# Patient Record
Sex: Female | Born: 1975 | Race: Black or African American | Hispanic: No | Marital: Single | Smoking: Former smoker
Health system: Southern US, Community
[De-identification: ages and names within clinical notes are randomized; demographics above are authoritative.]

## PROBLEM LIST (undated history)

## (undated) DIAGNOSIS — N200 Calculus of kidney: Secondary | ICD-10-CM

## (undated) DIAGNOSIS — B1910 Unspecified viral hepatitis B without hepatic coma: Secondary | ICD-10-CM

## (undated) HISTORY — PX: TUBAL LIGATION: SHX77

## (undated) HISTORY — PX: ANKLE SURGERY: SHX546

## (undated) HISTORY — PX: KNEE SURGERY: SHX244

---

## 2010-01-18 ENCOUNTER — Emergency Department (HOSPITAL_COMMUNITY): Admission: EM | Admit: 2010-01-18 | Discharge: 2010-01-18 | Payer: Self-pay | Admitting: Family Medicine

## 2011-08-12 ENCOUNTER — Emergency Department (HOSPITAL_BASED_OUTPATIENT_CLINIC_OR_DEPARTMENT_OTHER)
Admission: EM | Admit: 2011-08-12 | Discharge: 2011-08-12 | Disposition: A | Discharge status: Home / Self Care | Payer: Medicaid Other | Attending: Emergency Medicine | Admitting: Emergency Medicine

## 2011-08-12 ENCOUNTER — Encounter: Payer: Self-pay | Admitting: *Deleted

## 2011-08-12 DIAGNOSIS — M65839 Other synovitis and tenosynovitis, unspecified forearm: Secondary | ICD-10-CM | POA: Insufficient documentation

## 2011-08-12 DIAGNOSIS — M65849 Other synovitis and tenosynovitis, unspecified hand: Secondary | ICD-10-CM | POA: Insufficient documentation

## 2011-08-12 DIAGNOSIS — E119 Type 2 diabetes mellitus without complications: Secondary | ICD-10-CM | POA: Insufficient documentation

## 2011-08-12 DIAGNOSIS — M779 Enthesopathy, unspecified: Secondary | ICD-10-CM

## 2011-08-12 MED ORDER — HYDROCODONE-ACETAMINOPHEN 5-500 MG PO TABS: 1.0000 | ORAL_TABLET | Freq: Four times a day (QID) | ORAL | Status: AC | PRN

## 2011-08-12 MED ORDER — HYDROCODONE-ACETAMINOPHEN 5-325 MG PO TABS
1.0000 | ORAL_TABLET | Freq: Once | ORAL | Status: AC
  Administered 2011-08-12: 1 via ORAL
  Filled 2011-08-12: qty 1

## 2011-08-12 MED ORDER — IBUPROFEN 800 MG PO TABS: 800.0000 mg | ORAL_TABLET | Freq: Three times a day (TID) | ORAL | Status: AC

## 2011-08-12 NOTE — ED Notes (Signed)
Pt states that she began having pain in her right wrist when she was 6 months pregnant. Given injection. Now having pain in left wrist.

## 2011-08-13 NOTE — ED Provider Notes (Signed)
History     CSN: [id number] Arrival date & time: 08/12/2011  8:25 PM  Chief Complaint  Patient presents with  . Wrist Pain   HPI Comments: 35yoF previously healthy pw b/l lateral wrist pain. States that she has experienced similar since pregancy, total 9 months she has been having the pain greater on Rt wrist. Worse with extension of thumb and wrist movement. Denies numbness/tingling/weakness of fingers. 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 Also lifts her child at home. Denies other injury/complaint  Patient is a 35 y.o. female presenting with wrist pain.  Wrist Pain    Past Medical History  Diagnosis Date  . Diabetes mellitus     Past Surgical History  Procedure Date  . Tubal ligation   . Ankle surgery     History reviewed. No pertinent family history.  History  Substance Use Topics  . Smoking status: Never Smoker   . Smokeless tobacco: Not on file  . Alcohol Use: No      Review of Systems  All other systems reviewed and are negative.  except as noted HPI   Physical Exam  BP 128/78  Pulse 84  Temp(Src) 98.2 F (36.8 C) (Oral)  Resp 20  Ht 5\' 6"  (1.676 m)  Wt 210 lb (95.255 kg)  BMI 33.89 kg/m2  SpO2 98%  Physical Exam  Nursing note and vitals reviewed. Constitutional: He is oriented to person, place, and time. He appears well-developed and well-nourished. No distress.  HENT:  Head: Atraumatic.  Mouth/Throat: Oropharynx is clear and moist.  Eyes: Conjunctivae are normal. Pupils are equal, round, and reactive to light.  Neck: Neck supple.  Cardiovascular: Normal rate, regular rhythm, normal heart sounds and intact distal pulses.  Exam reveals no gallop and no friction rub.   No murmur heard. Pulmonary/Chest: Effort normal. No respiratory distress. He has no wheezes. He has no rales.  Abdominal: Soft. Bowel sounds are normal. There is no tenderness. There is no rebound and no guarding.  Musculoskeletal: Normal range of  motion. He exhibits no edema and no tenderness.       Arms:      +ttp, no swelling/erythema  Neurological: He is alert and oriented to person, place, and time.       Gross sensation intact fingers Cap refill < 3  Skin: Skin is warm and dry.  Psychiatric: He has a normal mood and affect.    ED Course  Procedures  MDM Likely tendonitis. Wrist braces, pain control. Home with same. PMD f/u  Stefano Gaul, MD       Forbes Cellar, MD 08/13/11 [phone]

## 2012-02-19 ENCOUNTER — Encounter (HOSPITAL_BASED_OUTPATIENT_CLINIC_OR_DEPARTMENT_OTHER): Payer: Self-pay | Admitting: *Deleted

## 2012-02-19 ENCOUNTER — Emergency Department (HOSPITAL_BASED_OUTPATIENT_CLINIC_OR_DEPARTMENT_OTHER)
Admission: EM | Admit: 2012-02-19 | Discharge: 2012-02-19 | Disposition: A | Discharge status: Home / Self Care | Payer: Medicaid Other | Attending: Emergency Medicine | Admitting: Emergency Medicine

## 2012-02-19 DIAGNOSIS — R5381 Other malaise: Secondary | ICD-10-CM | POA: Insufficient documentation

## 2012-02-19 DIAGNOSIS — M79609 Pain in unspecified limb: Secondary | ICD-10-CM | POA: Insufficient documentation

## 2012-02-19 DIAGNOSIS — IMO0002 Reserved for concepts with insufficient information to code with codable children: Secondary | ICD-10-CM

## 2012-02-19 DIAGNOSIS — E119 Type 2 diabetes mellitus without complications: Secondary | ICD-10-CM | POA: Insufficient documentation

## 2012-02-19 DIAGNOSIS — M6580 Other synovitis and tenosynovitis, unspecified site: Secondary | ICD-10-CM | POA: Insufficient documentation

## 2012-02-19 MED ORDER — IBUPROFEN 800 MG PO TABS: 800.0000 mg | ORAL_TABLET | Freq: Three times a day (TID) | ORAL | Status: AC

## 2012-02-19 NOTE — ED Notes (Signed)
Right arm pain. No known injury. Pain goes from her wrist into her forearm. Hx of tendonitis but this pain does not feel the same.

## 2012-02-19 NOTE — ED Provider Notes (Signed)
Medical screening examination/treatment/procedure(s) were performed by non-physician practitioner and as supervising physician I was immediately available for consultation/collaboration.  Ethelda Chick, MD 02/19/12 [phone]

## 2012-02-19 NOTE — ED Provider Notes (Signed)
History     CSN: [id number]  Arrival date & time 02/19/12  1617   First MD Initiated Contact with Patient 02/19/12 1741      Chief Complaint  Patient presents with  . Arm Pain    (Consider location/radiation/quality/duration/timing/severity/associated sxs/prior treatment) Patient is a 36 y.o. female presenting with arm pain. The history is provided by the patient. No language interpreter was used.  Arm Pain This is a new problem. The current episode started more than 1 month ago. The problem occurs constantly. The problem has been gradually worsening. Associated symptoms include myalgias and weakness. The symptoms are aggravated by nothing. She has tried nothing for the symptoms. The treatment provided moderate relief.  Pt complains of pain in right arm.  Pt reports pain is worse when she turns arm from side to side  Past Medical History  Diagnosis Date  . Diabetes mellitus     Past Surgical History  Procedure Date  . Tubal ligation   . Ankle surgery     No family history on file.  History  Substance Use Topics  . Smoking status: Never Smoker   . Smokeless tobacco: Not on file  . Alcohol Use: No    OB History    Grav Para Term Preterm Abortions TAB SAB Ect Mult Living                  Review of Systems  Musculoskeletal: Positive for myalgias.  Neurological: Positive for weakness.  All other systems reviewed and are negative.    Allergies  Review of patient's allergies indicates no known allergies.  Home Medications   Current Outpatient Rx  Name Route Sig Dispense Refill  . HYDROCODONE-ACETAMINOPHEN 2.5-500 MG PO TABS Oral Take 1 tablet by mouth every 6 (six) hours as needed. Patient used this medication for pain.    Marland Kitchen MECLIZINE HCL 12.5 MG PO TABS Oral Take 12.5 mg by mouth 3 (three) times daily as needed. Patient uses this medication for allergies.    Marland Kitchen METFORMIN HCL 500 MG PO TABS Oral Take 500 mg by mouth 2 (two) times daily as needed. Blood sugar         BP 119/85  Pulse 101  Temp(Src) 98.2 F (36.8 C) (Oral)  Resp 20  Wt 213 lb (96.616 kg)  SpO2 100%  Physical Exam  Nursing note and vitals reviewed. Constitutional: She is oriented to person, place, and time. She appears well-developed and well-nourished.  HENT:  Head: Normocephalic and atraumatic.  Musculoskeletal: She exhibits tenderness.       Right arm,  Pain with pronation and supination,  Ns and nv intact  Neurological: She is alert and oriented to person, place, and time.  Skin: Skin is warm.  Psychiatric: She has a normal mood and affect.    ED Course  Procedures (including critical care time)  Labs Reviewed - No data to display No results found.   No diagnosis found.    MDM    Pt counseled on tendonitis.  I advised follow up with Dr. Pearletha Forge.       Lonia Skinner [place], [place] 02/19/12 1800

## 2012-02-19 NOTE — Discharge Instructions (Signed)
Tendinitis  Tendinitis is swelling and inflammation of the tendons. Tendons are band-like tissues that connect muscle to bone. Tendinitis commonly occurs in the:    Shoulders (rotator cuff).   Heels (Achilles tendon).   Elbows (triceps tendon).  CAUSES  Tendinitis is usually caused by overusing the tendon, muscles, and joints involved. When the tissue surrounding a tendon (synovium) becomes inflamed, it is called tenosynovitis. Tendinitis commonly develops in people whose jobs require repetitive motions.  SYMPTOMS   Pain.   Tenderness.   Mild swelling.  DIAGNOSIS  Tendinitis is usually diagnosed by physical exam. Your caregiver may also order X-rays or other imaging tests.  TREATMENT  Your caregiver may recommend certain medicines or exercises for your treatment.  HOME CARE INSTRUCTIONS    Use a sling or splint for as long as directed by your caregiver until the pain decreases.   Put ice on the injured area.   Put ice in a plastic bag.   Place a towel between your skin and the bag.   Leave the ice on for 15 to 20 minutes, 3 to 4 times a day.   Avoid using the limb while the tendon is painful. Perform gentle range of motion exercises only as directed by your caregiver. Stop exercises if pain or discomfort increase, unless directed otherwise by your caregiver.   Only take over-the-counter or prescription medicines for pain, discomfort, or fever as directed by your caregiver.  SEEK MEDICAL CARE IF:    Your pain and swelling increase.   You develop new, unexplained symptoms, especially increased numbness in the hands.  MAKE SURE YOU:    Understand these instructions.   Will watch your condition.   Will get help right away if you are not doing well or get worse.  Document Released: 11/17/2000 Document Revised: 11/09/2011 Document Reviewed: 02/06/2011  [employer] Patient Information 2012 [employer].

## 2012-07-30 ENCOUNTER — Encounter (HOSPITAL_BASED_OUTPATIENT_CLINIC_OR_DEPARTMENT_OTHER): Payer: Self-pay

## 2012-07-30 ENCOUNTER — Emergency Department (HOSPITAL_BASED_OUTPATIENT_CLINIC_OR_DEPARTMENT_OTHER): Payer: Medicaid Other

## 2012-07-30 ENCOUNTER — Emergency Department (HOSPITAL_BASED_OUTPATIENT_CLINIC_OR_DEPARTMENT_OTHER)
Admission: EM | Admit: 2012-07-30 | Discharge: 2012-07-30 | Disposition: A | Discharge status: Home / Self Care | Payer: Medicaid Other | Attending: Emergency Medicine | Admitting: Emergency Medicine

## 2012-07-30 DIAGNOSIS — E119 Type 2 diabetes mellitus without complications: Secondary | ICD-10-CM | POA: Insufficient documentation

## 2012-07-30 DIAGNOSIS — M722 Plantar fascial fibromatosis: Secondary | ICD-10-CM | POA: Insufficient documentation

## 2012-07-30 MED ORDER — IBUPROFEN 800 MG PO TABS: 800.0000 mg | ORAL_TABLET | Freq: Three times a day (TID) | ORAL | Status: AC

## 2012-07-30 MED ORDER — HYDROCODONE-ACETAMINOPHEN 5-325 MG PO TABS
2.0000 | ORAL_TABLET | Freq: Once | ORAL | Status: AC
  Administered 2012-07-30: 2 via ORAL
  Filled 2012-07-30: qty 2

## 2012-07-30 MED ORDER — HYDROCODONE-ACETAMINOPHEN 5-325 MG PO TABS: 2.0000 | ORAL_TABLET | ORAL | Status: AC | PRN

## 2012-07-30 NOTE — ED Notes (Signed)
Pt reports right heel pain x 4 days without injury.

## 2012-07-30 NOTE — ED Provider Notes (Signed)
History     CSN: [id number]  Arrival date & time 07/30/12  1415   First MD Initiated Contact with Patient 07/30/12 1458      Chief Complaint  Patient presents with  . Foot Pain    (Consider location/radiation/quality/duration/timing/severity/associated sxs/prior treatment) Patient is a 36 y.o. female presenting with lower extremity pain. The history is provided by the patient. No language interpreter was used.  Foot Pain This is a new problem. The current episode started in the past 7 days. The problem occurs constantly. The problem has been gradually worsening. Associated symptoms include myalgias. Nothing aggravates the symptoms. She has tried nothing for the symptoms. The treatment provided no relief.  Pt complains of pain in her heel.  Pt reports pain with walking  Past Medical History  Diagnosis Date  . Diabetes mellitus     Past Surgical History  Procedure Date  . Tubal ligation   . Ankle surgery     No family history on file.  History  Substance Use Topics  . Smoking status: Never Smoker   . Smokeless tobacco: Not on file  . Alcohol Use: No    OB History    Grav Para Term Preterm Abortions TAB SAB Ect Mult Living                  Review of Systems  Musculoskeletal: Positive for myalgias.  All other systems reviewed and are negative.    Allergies  Review of patient's allergies indicates no known allergies.  Home Medications   Current Outpatient Rx  Name Route Sig Dispense Refill  . NAPROXEN SODIUM 220 MG PO TABS Oral Take 220 mg by mouth 2 (two) times daily with a meal. For pain.    Marland Kitchen HYDROCODONE-ACETAMINOPHEN 2.5-500 MG PO TABS Oral Take 1 tablet by mouth every 6 (six) hours as needed. Patient used this medication for pain.    Marland Kitchen MECLIZINE HCL 12.5 MG PO TABS Oral Take 12.5 mg by mouth 3 (three) times daily as needed. Patient uses this medication for allergies.      BP 132/75  Pulse 90  Temp 98.3 F (36.8 C) (Oral)  Resp 18  Ht 5\' 7"  (1.702  m)  Wt 210 lb (95.255 kg)  BMI 32.89 kg/m2  SpO2 100%  LMP 07/16/2012  Physical Exam  Vitals reviewed. Constitutional: She appears well-developed and well-nourished.  HENT:  Head: Normocephalic and atraumatic.  Musculoskeletal: She exhibits tenderness.       Tender right heel,  From,  nv and ns intact  Neurological: She is alert.  Skin: Skin is warm and dry.  Psychiatric: She has a normal mood and affect.    ED Course  Procedures (including critical care time)  Labs Reviewed - No data to display Dg Foot Complete Right  07/30/2012  *RADIOLOGY REPORT*  Clinical Data: Foot/heel pain  RIGHT FOOT COMPLETE - 3+ VIEW  Comparison: None.  Findings: No fracture or dislocation is seen.  The joint spaces are preserved.  Mild tibiotalar degenerative changes.  Plantar calcaneal enthesopathy.  Visualized soft tissues are grossly unremarkable.  IMPRESSION: No acute osseous abnormality is seen.  Plantar calcaneal enthesophyte.   Original Report Authenticated By: Charline Bills, M.D.      1. Plantar fasciitis       MDM  Pt placed in a post op shoe and ace wrap,   Pt given rx for ibuprofen and hydrocodone       Lonia Skinner [place], [place] 07/30/12 1604  Lonia Skinner [place],  [place] 07/30/12 1609

## 2012-07-31 NOTE — ED Provider Notes (Signed)
Medical screening examination/treatment/procedure(s) were performed by non-physician practitioner and as supervising physician I was immediately available for consultation/collaboration.   David H Yao, MD 07/31/12 1506 

## 2012-11-16 ENCOUNTER — Encounter (HOSPITAL_BASED_OUTPATIENT_CLINIC_OR_DEPARTMENT_OTHER): Payer: Self-pay | Admitting: Emergency Medicine

## 2012-11-16 ENCOUNTER — Emergency Department (HOSPITAL_BASED_OUTPATIENT_CLINIC_OR_DEPARTMENT_OTHER)
Admission: EM | Admit: 2012-11-16 | Discharge: 2012-11-16 | Disposition: A | Discharge status: Home / Self Care | Payer: Medicaid Other | Attending: Emergency Medicine | Admitting: Emergency Medicine

## 2012-11-16 DIAGNOSIS — M25511 Pain in right shoulder: Secondary | ICD-10-CM

## 2012-11-16 DIAGNOSIS — R739 Hyperglycemia, unspecified: Secondary | ICD-10-CM

## 2012-11-16 DIAGNOSIS — IMO0001 Reserved for inherently not codable concepts without codable children: Secondary | ICD-10-CM | POA: Insufficient documentation

## 2012-11-16 DIAGNOSIS — R7309 Other abnormal glucose: Secondary | ICD-10-CM | POA: Insufficient documentation

## 2012-11-16 DIAGNOSIS — Z9851 Tubal ligation status: Secondary | ICD-10-CM | POA: Insufficient documentation

## 2012-11-16 DIAGNOSIS — Z8619 Personal history of other infectious and parasitic diseases: Secondary | ICD-10-CM | POA: Insufficient documentation

## 2012-11-16 DIAGNOSIS — E860 Dehydration: Secondary | ICD-10-CM | POA: Insufficient documentation

## 2012-11-16 DIAGNOSIS — M25512 Pain in left shoulder: Secondary | ICD-10-CM

## 2012-11-16 DIAGNOSIS — Z79899 Other long term (current) drug therapy: Secondary | ICD-10-CM | POA: Insufficient documentation

## 2012-11-16 DIAGNOSIS — E119 Type 2 diabetes mellitus without complications: Secondary | ICD-10-CM | POA: Insufficient documentation

## 2012-11-16 DIAGNOSIS — Z9889 Other specified postprocedural states: Secondary | ICD-10-CM | POA: Insufficient documentation

## 2012-11-16 HISTORY — DX: Unspecified viral hepatitis B without hepatic coma: B19.10

## 2012-11-16 LAB — BASIC METABOLIC PANEL
Calcium: 9.2 mg/dL (ref 8.4–10.5)
GFR calc Af Amer: >90 mL/min (ref >90)
GFR calc non Af Amer: >90 mL/min (ref >90)
Glucose, Bld: 525 mg/dL — ABNORMAL HIGH (ref 70–99)
Potassium: 3.8 mEq/L (ref 3.5–5.1)
Sodium: 133 mEq/L — ABNORMAL LOW (ref 135–145)

## 2012-11-16 LAB — CBC WITH DIFFERENTIAL/PLATELET
Basophils Relative: 0 % (ref 0–1)
Eosinophils Absolute: 0 10*3/uL (ref 0.0–0.7)
Hemoglobin: 14.7 g/dL (ref 12.0–15.0)
Lymphs Abs: 1.9 10*3/uL (ref 0.7–4.0)
MCH: 28.8 pg (ref 26.0–34.0)
MCHC: 36.1 g/dL — ABNORMAL HIGH (ref 30.0–36.0)
Monocytes Relative: 8 % (ref 3–12)
Neutro Abs: 2.5 10*3/uL (ref 1.7–7.7)
Neutrophils Relative %: 52 % (ref 43–77)
Platelets: 221 10*3/uL (ref 150–400)
RBC: 5.1 MIL/uL (ref 3.87–5.11)

## 2012-11-16 LAB — URINALYSIS, ROUTINE W REFLEX MICROSCOPIC
Leukocytes, UA: NEGATIVE
Nitrite: NEGATIVE
Protein, ur: NEGATIVE mg/dL
Specific Gravity, Urine: 1.039 — ABNORMAL HIGH (ref 1.005–1.030)
Urobilinogen, UA: 1 mg/dL (ref 0.0–1.0)

## 2012-11-16 LAB — HEPATIC FUNCTION PANEL
Bilirubin, Direct: 0.2 mg/dL (ref 0.0–0.3)
Indirect Bilirubin: 0.6 mg/dL (ref 0.3–0.9)
Total Protein: 7.8 g/dL (ref 6.0–8.3)

## 2012-11-16 LAB — URINE MICROSCOPIC-ADD ON

## 2012-11-16 LAB — GLUCOSE, CAPILLARY: Glucose-Capillary: 547 mg/dL — ABNORMAL HIGH (ref 70–99)

## 2012-11-16 LAB — PREGNANCY, URINE: Preg Test, Ur: NEGATIVE

## 2012-11-16 MED ORDER — SODIUM CHLORIDE 0.9 % IV SOLN: Freq: Once | INTRAVENOUS | Status: DC

## 2012-11-16 MED ORDER — METFORMIN HCL 500 MG PO TABS: 500.0000 mg | ORAL_TABLET | Freq: Two times a day (BID) | ORAL | Status: DC

## 2012-11-16 MED ORDER — SODIUM CHLORIDE 0.9 % IV BOLUS (SEPSIS)
500.0000 mL | Freq: Once | INTRAVENOUS | Status: AC
  Administered 2012-11-16: 500 mL via INTRAVENOUS

## 2012-11-16 MED ORDER — SODIUM CHLORIDE 0.9 % IV BOLUS (SEPSIS)
1000.0000 mL | Freq: Once | INTRAVENOUS | Status: AC
  Administered 2012-11-16: 1000 mL via INTRAVENOUS

## 2012-11-16 NOTE — ED Notes (Addendum)
C/o bilat arm pain X4d, sts pain only at night, denies trauma, no deformity noted, no F/V/D or other complaints, Alieve pta, also sts she is out of her DM meds and has not checked her sugar recently, NAD

## 2012-11-16 NOTE — ED Provider Notes (Signed)
History     CSN: [id number]  Arrival date & time 11/16/12  4098   First MD Initiated Contact with Patient 11/16/12 1007      Chief Complaint  Patient presents with  . Arm Injury  . Hyperglycemia    Patient is a 36 y.o. female presenting with arm injury.  Arm Injury  The incident occurred yesterday. Injury mechanism: no injury. There is an injury to the right upper arm and left upper arm. The pain is mild. Pertinent negatives include no abdominal pain, no vomiting, no focal weakness, no weakness and no difficulty breathing.  worsened by - palpation Improved by - rest  Pt reports both shoulders/upper arms have been sore for past day No injury. No trauma.  No falls.  No weakness.  Reports increase in use of her arms at work.  No swelling/redness reported to her arms  Also reports out of her metformin for 2 weeks.  She has not been checking her glucose regularly.  Denies vomiting/diarrhea/fever/chills/abd pain/dysuria  She reports brief episode of chest pain yesterday but denies any associated symptoms     Past Medical History  Diagnosis Date  . Diabetes mellitus   . Hepatitis B     Past Surgical History  Procedure Date  . Tubal ligation   . Ankle surgery     No family history on file.  History  Substance Use Topics  . Smoking status: Never Smoker   . Smokeless tobacco: Not on file  . Alcohol Use: No    OB History    Grav Para Term Preterm Abortions TAB SAB Ect Mult Living                  Review of Systems  Constitutional: Negative for fever.  Respiratory: Negative for shortness of breath.   Cardiovascular: Negative for leg swelling.  Gastrointestinal: Negative for vomiting and abdominal pain.  Musculoskeletal: Positive for myalgias.  Skin: Negative for wound.  Neurological: Negative for focal weakness and weakness.  Psychiatric/Behavioral: Negative for agitation.  All other systems reviewed and are negative.    Allergies  Review of patient's  allergies indicates no known allergies.  Home Medications   Current Outpatient Rx  Name  Route  Sig  Dispense  Refill  . METFORMIN HCL 500 MG PO TABS   Oral   Take 500 mg by mouth 2 (two) times daily with a meal.         . HYDROCODONE-ACETAMINOPHEN 2.5-500 MG PO TABS   Oral   Take 1 tablet by mouth every 6 (six) hours as needed. Patient used this medication for pain.         Marland Kitchen MECLIZINE HCL 12.5 MG PO TABS   Oral   Take 12.5 mg by mouth 3 (three) times daily as needed. Patient uses this medication for allergies.         Marland Kitchen NAPROXEN SODIUM 220 MG PO TABS   Oral   Take 220 mg by mouth 2 (two) times daily with a meal. For pain.           BP 135/78  Pulse 92  Temp 98.7 F (37.1 C) (Oral)  Resp 16  Ht 5\' 6"  (1.676 m)  Wt 205 lb (92.987 kg)  BMI 33.09 kg/m2  SpO2 100%  Physical Exam CONSTITUTIONAL: Well developed/well nourished HEAD AND FACE: Normocephalic/atraumatic EYES: EOMI/PERRL, no icterus ENMT: Mucous membranes moist NECK: supple no meningeal signs SPINE:entire spine nontender CV: S1/S2 noted, no murmurs/rubs/gallops noted LUNGS: Lungs are clear  to auscultation bilaterally, no apparent distress ABDOMEN: soft, nontender, no rebound or guarding GU:no cva tenderness NEURO: Pt is awake/alert, moves all extremitiesx4 Equal power with hand grip, wrist flex/extension, elbow flex/extension, and equal power with shoulder abduction/adduction.  No focal sensory deficit is noted in either UE.   Equal biceps/brachioradial reflex in bilateral UE EXTREMITIES: pulses normal, full ROM. Mild tenderness to palpation of both shoulders but no bruising, no edema or erythema noted to her upper extremities SKIN: warm, color normal PSYCH: no abnormalities of mood noted  ED Course  Procedures   Labs Reviewed  GLUCOSE, CAPILLARY - Abnormal; Notable for the following:    Glucose-Capillary 547 (*)     All other components within normal limits  BASIC METABOLIC PANEL  CBC WITH  DIFFERENTIAL  URINALYSIS, ROUTINE W REFLEX MICROSCOPIC  PREGNANCY, URINE  HEPATIC FUNCTION PANEL   10:43 AM Pt well appearing.  Does not smell ketotic.  Will rehydrate For her arm pain, her exam is unremarkable.  Denies any active CP at this time, but reported brief episode yesterday.  Doubt ACS.  EKG unremarkable 12:49 PM Pt stable in ED Will d/c home with her metformin and need for outpatient followup    MDM  Nursing notes including past medical history and social history reviewed and considered in documentation Labs/vital reviewed and considered    Date: 11/16/2012  Rate: 80  Rhythm: normal sinus rhythm  QRS Axis: normal  Intervals: normal  ST/T Wave abnormalities: nonspecific ST changes  Conduction Disutrbances:none  Narrative Interpretation:   Old EKG Reviewed: none available at time of interpretation        Joya Gaskins, MD 11/16/12 1249

## 2012-11-16 NOTE — ED Notes (Signed)
MD at bedside. 

## 2012-11-16 NOTE — ED Notes (Signed)
CBG 547.  RN notified at bedside.

## 2012-11-16 NOTE — ED Notes (Signed)
Pt also reports at triage that her glucometer is broken and she has been out of her meds for a couple of days. Requesting CBG.

## 2013-03-29 ENCOUNTER — Encounter (HOSPITAL_BASED_OUTPATIENT_CLINIC_OR_DEPARTMENT_OTHER): Payer: Self-pay | Admitting: *Deleted

## 2013-03-29 ENCOUNTER — Emergency Department (HOSPITAL_BASED_OUTPATIENT_CLINIC_OR_DEPARTMENT_OTHER)
Admission: EM | Admit: 2013-03-29 | Discharge: 2013-03-29 | Disposition: A | Discharge status: Home / Self Care | Payer: Medicaid Other | Attending: Emergency Medicine | Admitting: Emergency Medicine

## 2013-03-29 DIAGNOSIS — L259 Unspecified contact dermatitis, unspecified cause: Secondary | ICD-10-CM | POA: Insufficient documentation

## 2013-03-29 DIAGNOSIS — E119 Type 2 diabetes mellitus without complications: Secondary | ICD-10-CM | POA: Insufficient documentation

## 2013-03-29 DIAGNOSIS — L239 Allergic contact dermatitis, unspecified cause: Secondary | ICD-10-CM

## 2013-03-29 DIAGNOSIS — Z8619 Personal history of other infectious and parasitic diseases: Secondary | ICD-10-CM | POA: Insufficient documentation

## 2013-03-29 DIAGNOSIS — Z79899 Other long term (current) drug therapy: Secondary | ICD-10-CM | POA: Insufficient documentation

## 2013-03-29 NOTE — ED Provider Notes (Signed)
History     CSN: [id number]  Arrival date & time 03/29/13  1601   First MD Initiated Contact with Patient 03/29/13 1718      Chief Complaint  Patient presents with  . Pruritis    (Consider location/radiation/quality/duration/timing/severity/associated sxs/prior treatment) Patient is a 37 y.o. female presenting with rash. The history is provided by the patient. No language interpreter was used.  Rash Location:  Full body Quality: dryness and itchiness   Severity:  Mild Timing:  Constant Chronicity:  New Relieved by:  Nothing Worsened by:  Nothing tried  Pt reports she has scattered red bumps.   Pt complains of itching Past Medical History  Diagnosis Date  . Diabetes mellitus   . Hepatitis B     Past Surgical History  Procedure Laterality Date  . Tubal ligation    . Ankle surgery      No family history on file.  History  Substance Use Topics  . Smoking status: Never Smoker   . Smokeless tobacco: Never Used  . Alcohol Use: No    OB History   Grav Para Term Preterm Abortions TAB SAB Ect Mult Living                  Review of Systems  Skin: Positive for rash.  All other systems reviewed and are negative.    Allergies  Review of patient's allergies indicates no known allergies.  Home Medications   Current Outpatient Rx  Name  Route  Sig  Dispense  Refill  . Canagliflozin (INVOKANA) 300 MG TABS   Oral   Take 1 tablet by mouth daily.         . cetirizine (ZYRTEC) 10 MG tablet   Oral   Take 10 mg by mouth daily.         Marland Kitchen HYDROcodone-acetaminophen (VICODIN) 2.5-500 MG per tablet   Oral   Take 1 tablet by mouth every 6 (six) hours as needed. Patient used this medication for pain.         . meclizine (ANTIVERT) 12.5 MG tablet   Oral   Take 12.5 mg by mouth 3 (three) times daily as needed. Patient uses this medication for allergies.         . metFORMIN (GLUCOPHAGE) 500 MG tablet   Oral   Take 1 tablet (500 mg total) by mouth 2 (two)  times daily with a meal.   60 tablet   0     BP 148/86  Pulse 98  Temp(Src) 98.6 F (37 C) (Oral)  Resp 18  SpO2 99%  LMP 03/15/2013  Physical Exam  Nursing note and vitals reviewed. Constitutional: She is oriented to person, place, and time. She appears well-developed and well-nourished.  HENT:  Head: Normocephalic.  Right Ear: External ear normal.  Left Ear: External ear normal.  Mouth/Throat: Oropharynx is clear and moist.  Eyes: Pupils are equal, round, and reactive to light.  Neck: Normal range of motion. Neck supple.  Cardiovascular: Normal rate and normal heart sounds.   Pulmonary/Chest: Effort normal and breath sounds normal.  Musculoskeletal: Normal range of motion.  Neurological: She is alert and oriented to person, place, and time. She has normal reflexes.  Skin: Rash noted.  Scattered small red pimples   Psychiatric: She has a normal mood and affect.    ED Course  Procedures (including critical care time)  Labs Reviewed - No data to display No results found.   1. Allergic dermatitis  MDM  Zyrtec,  hydrocortizone        Elson Areas, PA-C 03/29/13 2102

## 2013-03-29 NOTE — ED Notes (Signed)
C/o itching x 3 days- small red bumps noted by pt on leg and chest

## 2013-03-29 NOTE — ED Provider Notes (Signed)
Medical screening examination/treatment/procedure(s) were performed by non-physician practitioner and as supervising physician I was immediately available for consultation/collaboration.   Glynn Octave, MD 03/29/13 [phone]

## 2013-07-08 LAB — HM DIABETES FOOT EXAM

## 2013-07-15 ENCOUNTER — Ambulatory Visit: Payer: Medicaid Other | Admitting: Internal Medicine

## 2013-08-14 ENCOUNTER — Ambulatory Visit (INDEPENDENT_AMBULATORY_CARE_PROVIDER_SITE_OTHER): Payer: BC Managed Care – PPO | Admitting: Internal Medicine

## 2013-08-14 ENCOUNTER — Encounter: Payer: Self-pay | Admitting: Internal Medicine

## 2013-08-14 VITALS — BP 104/62 | HR 97 | Temp 98.4°F | Resp 10 | Ht 68.0 in | Wt 193.0 lb

## 2013-08-14 DIAGNOSIS — E1149 Type 2 diabetes mellitus with other diabetic neurological complication: Secondary | ICD-10-CM

## 2013-08-14 DIAGNOSIS — E119 Type 2 diabetes mellitus without complications: Secondary | ICD-10-CM | POA: Insufficient documentation

## 2013-08-14 HISTORY — DX: Type 2 diabetes mellitus with other diabetic neurological complication: E11.49

## 2013-08-14 HISTORY — DX: Type 2 diabetes mellitus without complications: E11.9

## 2013-08-14 MED ORDER — METFORMIN HCL 500 MG PO TABS: 1000.0000 mg | ORAL_TABLET | Freq: Two times a day (BID) | ORAL | Status: DC

## 2013-08-14 MED ORDER — INSULIN DETEMIR 100 UNIT/ML ~~LOC~~ SOLN: 30.0000 [IU] | Freq: Every day | SUBCUTANEOUS | Status: AC

## 2013-08-14 NOTE — Patient Instructions (Addendum)
Please return in 1 month with your sugar log. Check sugars 2x a day, rotating checks. Increase Metformin ER to 1000 mg 2x a day. Decrease Levemir to 30 units at night. Please start a B-complex vitamin daily.  PATIENT INSTRUCTIONS FOR TYPE 2 DIABETES:  **Please join MyChart!** - see attached instructions about how to join   DIET AND EXERCISE Diet and exercise is an important part of diabetic treatment.  We recommended aerobic exercise in the form of brisk walking (working between 40-60% of maximal aerobic capacity, similar to brisk walking) for 150 minutes per week (such as 30 minutes five days per week) along with 3 times per week performing 'resistance' training (using various gauge rubber tubes with handles) 5-10 exercises involving the major muscle groups (upper body, lower body and core) performing 10-15 repetitions (or near fatigue) each exercise. Start at half the above goal but build slowly to reach the above goals. If limited by weight, joint pain, or disability, we recommend daily walking in a swimming pool with water up to waist to reduce pressure from joints while allow for adequate exercise.    BLOOD GLUCOSES Monitoring your blood glucoses is important for continued management of your diabetes. Please check your blood glucoses 2-4 times a day: fasting, before meals and at bedtime (you can rotate these measurements - e.g. one day check before the 3 meals, the next day check before 2 of the meals and before bedtime, etc.   HYPOGLYCEMIA (low blood sugar) Hypoglycemia is usually a reaction to not eating, exercising, or taking too much insulin/ other diabetes drugs.  Symptoms include tremors, sweating, hunger, confusion, headache, etc. Treat IMMEDIATELY with 15 grams of Carbs:   4 glucose tablets    cup regular juice/soda   2 tablespoons raisins   4 teaspoons sugar   1 tablespoon honey Recheck blood glucose in 15 mins and repeat above if still symptomatic/blood glucose <100. Please  contact our office at [phone] if you have questions about how to next handle your insulin.  RECOMMENDATIONS TO REDUCE YOUR RISK OF DIABETIC COMPLICATIONS: * Take your prescribed MEDICATION(S). * Follow a DIABETIC diet: Complex carbs, fiber rich foods, heart healthy fish twice weekly, (monounsaturated and polyunsaturated) fats * AVOID saturated/trans fats, high fat foods, >2,300 mg salt per day. * EXERCISE at least 5 times a week for 30 minutes or preferably daily.  * DO NOT SMOKE OR DRINK more than 1 drink a day. * Check your FEET every day. Do not wear tightfitting shoes. Contact us if you develop an ulcer * See your EYE doctor once a year or more if needed * Get a FLU shot once a year * Get a PNEUMONIA vaccine once before and once after age 37 years  GOALS:  * Your Hemoglobin A1c of <7%  * fasting sugars need to be <130 * after meals sugars need to be <180 (2h after you start eating) * Your Systolic BP should be 140 or lower  * Your Diastolic BP should be 80 or lower  * Your HDL (Good Cholesterol) should be 40 or higher  * Your LDL (Bad Cholesterol) should be 100 or lower  * Your Triglycerides should be 150 or lower  * Your Urine microalbumin (kidney function) should be <30 * Your Body Mass Index should be 25 or lower   We will be glad to help you achieve these goals. Our telephone number is: [phone].

## 2013-08-14 NOTE — Progress Notes (Signed)
Patient ID: Tamara Shannon, female   DOB: [birth year], 37 y.o.   MRN: [id number]  HPI: Tamara Shannon is a 37 y.o.-year-old female, referred by her PCP, Daphane Shepherd PA, for management of DM2, insulin-dependent, uncontrolled, with complications (peripheral neuropathy). She was previously referred to [facility] endocrinology but she no showed/cancelled to many appointments so she was discharged.  Patient has been diagnosed with diabetes in 2004 (GDM); she was on insulin before, during the pregnancy. Last hemoglobin A1c was: 11.5% on 07/08/2013. Previous hemoglobin A1c was 12.5% on 05/01/2013.  Pt is on a regimen of: - Levemir 40 units each bedtime - started at 20 units on 07/08/2013 by PCP. - Metformin ER 500 mg po bid - not taking B12 vit. She was on Invokana 300 mg daily - had ran out at the last appointment with her PCP on 07/08/2013. Reviewing PCPs notes, the patient was not checking sugars at that time. She used to be on Byetta and Lantus.  Pt did not check sugars as she did not have a meter.  She gets toe pain when sugars high. No lows per her report except 2x this year (feels that she gets "the shakes" and sweating when low); she has hypoglycemia awareness at 80. Highest sugar was 400s, but unclear how high recently.   Pt's meals are: - Breakfast: banana, apple - Lunch: ham sandwich - Dinner: steak + rice + greens - Snacks: 2  - no CKD, last BUN/creatinine: 8/0.7 on 07/08/2013. Per review of the records in Epic:  Lab Results  Component Value Date   BUN 7 11/16/2012   CREATININE 0.50 11/16/2012  last ACR <30 on 04/29/2013.  - the patient has a history of hyperlipidemia. Not on statins.  - last eye exam was in 01/2013-vision screen. No DR.  - no numbness and tingling in her feet. Last with exam showed peripheral neuropathy bilateral feet - first, second, and third toes with decreased sensation to monofilament - performed at the last visit with PCP on 07/08/2013. She was started on  gabapentin started 07/08/2013, but did not work.  I reviewed her chart and she also has a history of obesity, migraines, tobacco dependence, cheerier hepatitis B.  Pt has FH of DM in mother, PGF.  ROS: Constitutional: no weight gain/loss, no fatigue, no subjective hyperthermia/hypothermia, + poor sleep Eyes: + blurry vision - right eye, no xerophthalmia ENT: no sore throat, no nodules palpated in throat, no dysphagia/odynophagia, no hoarseness Cardiovascular: no CP/SOB/palpitations/leg swelling Respiratory: no cough/SOB Gastrointestinal: no N/V/D/C Musculoskeletal: no muscle/+ joint aches Skin: no rashes; + itching Neurological: no tremors/numbness/tingling/dizziness; HAs Psychiatric: no depression/anxiety  Past Medical History  Diagnosis Date  . Diabetes mellitus   . Hepatitis B    Past Surgical History  Procedure Laterality Date  . Tubal ligation    . Ankle surgery     History   Social History  . Marital Status: Single    Spouse Name: N/A    Number of Children: 5: 50 to 36 y/o   Occupational History  . [employer]   Social History Main Topics  . Smoking status: Never Smoker   . Smokeless tobacco: Never Used  . Alcohol Use: No  . Drug Use: No  . Sexual Activity: Yes    Partners: Male    Birth Control/ Protection: Surgical   Social History Narrative   Regular exercise: walk   Caffeine use: none   Current Outpatient Prescriptions on File Prior to Visit  Medication Sig Dispense Refill  . Canagliflozin [facility])  300 MG TABS Take 1 tablet by mouth daily.      . cetirizine (ZYRTEC) 10 MG tablet Take 10 mg by mouth daily.      . meclizine (ANTIVERT) 12.5 MG tablet Take 12.5 mg by mouth 3 (three) times daily as needed. Patient uses this medication for allergies.      Marland Kitchen HYDROcodone-acetaminophen (VICODIN) 2.5-500 MG per tablet Take 1 tablet by mouth every 6 (six) hours as needed. Patient used this medication for pain.       No current facility-administered medications on  file prior to visit.   No Known Allergies  History reviewed. No pertinent family history.  PE: BP 104/62  Pulse 97  Temp(Src) 98.4 F (36.9 C) (Oral)  Resp 10  Ht 5\' 8"  (1.727 m)  Wt 193 lb (87.544 kg)  BMI 29.35 kg/m2  SpO2 97% Wt Readings from Last 3 Encounters:  08/14/13 193 lb (87.544 kg)  11/16/12 205 lb (92.987 kg)  07/30/12 210 lb (95.255 kg)   Constitutional: overweight, in NAD Eyes: PERRLA, EOMI, no exophthalmos ENT: moist mucous membranes, no thyromegaly, no cervical lymphadenopathy Cardiovascular: RRR, No MRG Respiratory: CTA B Gastrointestinal: abdomen soft, NT, ND, BS+ Musculoskeletal: no deformities, strength intact in all 4 Skin: moist, warm, no rashes Neurological: no tremor with outstretched hands, DTR normal in all 4  ASSESSMENT: 1. DM2, insulin-dependent, uncontrolled, with complications - PN  PLAN:  1. Patient with long-standing, uncontrolled diabetes, on recently-started basal insulin + oral antidiabetic regimen, with unclear control (pt does not check sugars) - first and most important, I advised her to pick up her [employer] meter+lancets+strips from [place] (plans to do this today), and then start checking sugars 2x a day, rotating checks. - given sugar log and advised how to fill it and to bring it at next appt  - We discussed about options for treatment, and I suggested to:  Increase Metformin ER to 1000 mg 2x a day. Decrease Levemir to 30 units at night. - she could not afford Invokana and Lantus in the past and was relying on samples from PCP. We were out of Lantus and Levemir pens today >> sent a Rx for Levemir to her pharmacy >> I hope [employer] covers it - refilled regular Metformin (she is not sure why she is on ER) - given foot care handout and explained the principles  - given instructions for hypoglycemia management "15-15 rule"  - advised for yearly eye exams - has them at [facility], where she works - will try to obtain latest lipid profile  from PCP - no labs needed, she is up to date with HbA1c, ACR, BUN/Cr, foot exam - advised to start a B-complex as she is on Metformin and this might help with neuropathy (Metformin reduces B12 vit GI absorption) - Return to clinic in one month with sugar log

## 2013-08-14 NOTE — Addendum Note (Signed)
Addended by: Carlus Pavlov on: 08/14/2013 09:58 AM   Modules accepted: Orders, Medications

## 2013-09-09 ENCOUNTER — Ambulatory Visit: Payer: BC Managed Care – PPO | Admitting: Internal Medicine

## 2013-09-23 ENCOUNTER — Ambulatory Visit: Payer: BC Managed Care – PPO | Admitting: Internal Medicine

## 2014-06-19 ENCOUNTER — Telehealth: Payer: Self-pay

## 2014-06-19 NOTE — Telephone Encounter (Signed)
LVM with pt to call back and schedule CPE and a1c recheck with Cruzita Lederer, MD  [place] [facility]iel Bundle/LDL needed as well.

## 2014-07-28 ENCOUNTER — Telehealth: Payer: Self-pay | Admitting: *Deleted

## 2014-07-28 NOTE — Telephone Encounter (Signed)
Diabetic bundle, left message for patient to call back and schedule

## 2015-04-30 ENCOUNTER — Emergency Department (HOSPITAL_BASED_OUTPATIENT_CLINIC_OR_DEPARTMENT_OTHER)
Admission: EM | Admit: 2015-04-30 | Discharge: 2015-04-30 | Disposition: A | Discharge status: Home / Self Care | Payer: BLUE CROSS/BLUE SHIELD | Attending: Emergency Medicine | Admitting: Emergency Medicine

## 2015-04-30 ENCOUNTER — Encounter (HOSPITAL_BASED_OUTPATIENT_CLINIC_OR_DEPARTMENT_OTHER): Payer: Self-pay | Admitting: *Deleted

## 2015-04-30 DIAGNOSIS — E119 Type 2 diabetes mellitus without complications: Secondary | ICD-10-CM | POA: Diagnosis not present

## 2015-04-30 DIAGNOSIS — Z79899 Other long term (current) drug therapy: Secondary | ICD-10-CM | POA: Diagnosis not present

## 2015-04-30 DIAGNOSIS — Z794 Long term (current) use of insulin: Secondary | ICD-10-CM | POA: Diagnosis not present

## 2015-04-30 DIAGNOSIS — L84 Corns and callosities: Secondary | ICD-10-CM | POA: Diagnosis not present

## 2015-04-30 DIAGNOSIS — Z8619 Personal history of other infectious and parasitic diseases: Secondary | ICD-10-CM | POA: Diagnosis not present

## 2015-04-30 DIAGNOSIS — M79675 Pain in left toe(s): Secondary | ICD-10-CM | POA: Diagnosis present

## 2015-04-30 LAB — CBG MONITORING, ED: Glucose-Capillary: 266 mg/dL — ABNORMAL HIGH (ref 65–99)

## 2015-04-30 NOTE — ED Provider Notes (Signed)
CSN: [id number]     Arrival date & time 04/30/15  0831 History   First MD Initiated Contact with Patient 04/30/15 [phone]     Chief Complaint  Patient presents with  . Toe Pain     (Consider location/radiation/quality/duration/timing/severity/associated sxs/prior Treatment) Patient is a 39 y.o. female presenting with toe pain. The history is provided by the patient.  Toe Pain This is a new problem. The current episode started more than 1 week ago. The problem occurs constantly. The problem has not changed since onset.Pertinent negatives include no chest pain, no abdominal pain, no headaches and no shortness of breath. Nothing aggravates the symptoms. Nothing relieves the symptoms. She has tried nothing for the symptoms. The treatment provided no relief.    Past Medical History  Diagnosis Date  . Diabetes mellitus   . Hepatitis B    Past Surgical History  Procedure Laterality Date  . Tubal ligation    . Ankle surgery     History reviewed. No pertinent family history. History  Substance Use Topics  . Smoking status: Never Smoker   . Smokeless tobacco: Never Used  . Alcohol Use: No   OB History    No data available     Review of Systems  Constitutional: Negative for fever and fatigue.  HENT: Negative for congestion and drooling.   Eyes: Negative for pain.  Respiratory: Negative for cough and shortness of breath.   Cardiovascular: Negative for chest pain.  Gastrointestinal: Negative for nausea, vomiting, abdominal pain and diarrhea.  Genitourinary: Negative for dysuria and hematuria.  Musculoskeletal: Negative for back pain, gait problem and neck pain.  Skin: Negative for color change.  Neurological: Negative for dizziness and headaches.  Hematological: Negative for adenopathy.  Psychiatric/Behavioral: Negative for behavioral problems.  All other systems reviewed and are negative.     Allergies  Review of patient's allergies indicates no known allergies.  Home  Medications   Prior to Admission medications   Medication Sig Start Date End Date Taking? Authorizing Provider  cetirizine (ZYRTEC) 10 MG tablet Take 10 mg by mouth daily.    Historical Provider, MD  HYDROcodone-acetaminophen (VICODIN) 2.5-500 MG per tablet Take 1 tablet by mouth every 6 (six) hours as needed. Patient used this medication for pain.    Historical Provider, MD  insulin detemir (LEVEMIR) 100 UNIT/ML injection Inject 0.3 mLs (30 Units total) into the skin at bedtime. 08/14/13   Philemon Kingdom, MD  metFORMIN (GLUCOPHAGE) 500 MG tablet Take 2 tablets (1,000 mg total) by mouth 2 (two) times daily with a meal. 08/14/13   Philemon Kingdom, MD   BP 159/80 mmHg  Pulse 98  Temp(Src) 98.8 F (37.1 C)  Resp 16  Ht 5\' 6"  (1.676 m)  Wt 206 lb (93.441 kg)  BMI 33.27 kg/m2  SpO2 100%  LMP 04/09/2015 Physical Exam  Constitutional: She is oriented to person, place, and time. She appears well-developed and well-nourished.  HENT:  Head: Normocephalic.  Mouth/Throat: Oropharynx is clear and moist. No oropharyngeal exudate.  Eyes: Conjunctivae and EOM are normal. Pupils are equal, round, and reactive to light.  Neck: Normal range of motion. Neck supple.  Cardiovascular: Normal rate, regular rhythm, normal heart sounds and intact distal pulses.  Exam reveals no gallop and no friction rub.   No murmur heard. Pulmonary/Chest: Effort normal and breath sounds normal. No respiratory distress. She has no wheezes.  Abdominal: Soft. Bowel sounds are normal. There is no tenderness. There is no rebound and no guarding.  Musculoskeletal: Normal  range of motion. She exhibits no edema or tenderness.  Normal range of motion of the ankles bilaterally.  No focal tenderness of the feet.  2+ distal pulses in bilateral lower extremities.  Small 1 cm x 1 cm callus noted on the plantar/inner aspect of the first digit on the left foot. No tenderness of this area. No erythema or evidence of infection. No  drainage.  Neurological: She is alert and oriented to person, place, and time.  Skin: Skin is warm and dry.  Psychiatric: She has a normal mood and affect. Her behavior is normal.  Nursing note and vitals reviewed.   ED Course  Procedures (including critical care time) Labs Review Labs Reviewed  CBG MONITORING, ED - Abnormal; Notable for the following:    Glucose-Capillary 266 (*)    All other components within normal limits    Imaging Review No results found.   EKG Interpretation None      MDM   Final diagnoses:  Callus of foot    8:48 AM 39 y.o. female who presents with a callus on the left great toe which she first noticed about 2 weeks ago. She notes occasional pain when standing for long periods of time at work but otherwise is asymptomatic. No bleeding or ulceration noted on exam. A small 1 cm x 1 cm callus is seen. Will recommend she continue to examine it daily and follow-up with a podiatrist. Possibly a plantars wart, will defer to podiatry for further recs.     Pamella Pert, MD 04/30/15 [phone]

## 2015-04-30 NOTE — ED Notes (Signed)
Pt c/o left great toe pain w/o injury HX DM

## 2015-04-30 NOTE — Discharge Instructions (Signed)
Corns and Calluses A thickening of the skin layer (usually over bony areas, such as toe joints) is known as a corn. Two types of corns exist: hard corns and soft corns. Calluses are painless areas of skin thickening that are caused by repeated pressure or irritation. Corns tend to affect toe joints and the skin between the toes; whereas, a callus can appear on any part of the body (especially the hands, feet, or knees).  SYMPTOMS   Corn:  Presence of a small (1/8 to 3/8 inch [3 to 10 mm in diameter]), painful bump on the side or over the joint of a toe.  Hard corns are more common on the outer portion of the little (fifth) toe at the joint.  Soft corns are more common between bony bumps (prominences), usually between the fourth and fifth toes or between the second and third toes.  Callus:  A rough, thickened area of skin that appears after repeated pressure or irritation. CAUSES  The purpose of corns and calluses is to protect an area of skin from injury caused by repeated irritation (rubbing or squeezing). The presence of pressure causes the skin cells to grow at a faster rate than the cells of unaffected areas. This leads to an overgrowth (corn or callus). As apposed to hard corns, soft corns tend to develop between toes, because there is more moisture. Soft corns are often the result of prolonged shoe wear, which leads to increased perspiration and moisture.  RISK INCREASES WITH:  Shoes that are too tight.  Occupations or sports that involve repetitive pressure on the hands (racquetball and baseball) or sudden stops on hard surfaces (track and tennis).  Sports that require the athlete to wear shoes, perspire, or wear clothing or protective gear that causes the production of heat and friction. PREVENTION  Properly fitted shoes and equipment.  Modify activities to prevent constant pressure on specific areas of skin.  If possible, wear padding over areas of skin that are exposed to  repeated pressure or irritation.  Keep the area between the toes dry (with powder or by removing shoes often).  Relieve shoe pressure by stretching the areas of the shoe that cause the pressure and or use ointments to soften leather shoes. PROGNOSIS  Corns and calluses typically subside if the activity that causes them is eliminated. Recovery may take up to 3 weeks. Recurrence is likely even with treatment if the cause is not removed.  RELATED COMPLICATIONS  If one overcompensates in an attempt to avoid pain, he or she may experience pain in other areas due to the changes in body movements (mechanics). TREATMENT  The best way to treat corns and calluses is to remove the source of pressure. Corn and callus pads may be helpful in reducing pressure on the affected skin. For soft corns, try to keep the affected area dry. If you cannot find shoes that fit properly, a shoe repair shop may be able to alter your shoes to reduce pressure. Occasionally a cushion for the bottom of the foot (metatarsal bar) worn within the shoe may relieve pressure on corns or calluses of the foot. For calluses, you may be able to peel or rub the thickened area with a pumice stone, sandstone, callus file, or with sandpaper to remove the callus; wetting the affected area may make this process more effective. Do not cut the corn or callus with a razor or knife. If the corn or callus must be removed, then a medically trained person should perform   the procedure. After peeling away the upper layers of a corn once or twice a day, it may be recommended to apply a non-prescription 5% to 10% salicylic ointment and cover the area with a bandage. It very uncommon to have the bony bumps (at toe joints) surgically removed. MEDICATION   If pain medication is necessary, nonsteroidal anti-inflammatory medications, such as aspirin and ibuprofen, or other minor pain relievers, such as acetaminophen, are often recommended. Contact your caregiver  immediately if any bleeding, stomach upset, or signs of an allergic reaction occur.  Topical salicylic ointments (5% to 10%) may be of benefit.  Prescription pain medications may be given by a caregiver. Use only as directed and only as much as you need.  Soak the foot for 20 minutes, twice a day, in a gallon of warm water. This may help to soften corns and calluses. Care should be taken to thoroughly dry the foot, especially between the toes, after soaking. SEEK MEDICAL CARE IF:   Symptoms get worse or do not improve in 2 weeks despite treatment.  Any signs of infection develop, including redness, swelling, increased pain or tenderness, or increased warmth around the corn or callus.  New, unexplained symptoms develop (drugs used in treatment may produce side effects). Document Released: 11/20/2005 Document Revised: 02/12/2012 Document Reviewed: 03/04/2009 [employer] Patient Information 2015 [employer]. This information is not intended to replace advice given to you by your health care provider. Make sure you discuss any questions you have with your health care provider.  

## 2015-08-24 ENCOUNTER — Encounter (HOSPITAL_BASED_OUTPATIENT_CLINIC_OR_DEPARTMENT_OTHER): Payer: Self-pay

## 2015-08-24 ENCOUNTER — Emergency Department (HOSPITAL_BASED_OUTPATIENT_CLINIC_OR_DEPARTMENT_OTHER)
Admission: EM | Admit: 2015-08-24 | Discharge: 2015-08-24 | Disposition: A | Discharge status: Home / Self Care | Payer: BLUE CROSS/BLUE SHIELD | Attending: Emergency Medicine | Admitting: Emergency Medicine

## 2015-08-24 ENCOUNTER — Emergency Department (HOSPITAL_BASED_OUTPATIENT_CLINIC_OR_DEPARTMENT_OTHER): Payer: BLUE CROSS/BLUE SHIELD

## 2015-08-24 DIAGNOSIS — Z79899 Other long term (current) drug therapy: Secondary | ICD-10-CM | POA: Diagnosis not present

## 2015-08-24 DIAGNOSIS — R319 Hematuria, unspecified: Secondary | ICD-10-CM

## 2015-08-24 DIAGNOSIS — R109 Unspecified abdominal pain: Secondary | ICD-10-CM | POA: Insufficient documentation

## 2015-08-24 DIAGNOSIS — Z87442 Personal history of urinary calculi: Secondary | ICD-10-CM | POA: Insufficient documentation

## 2015-08-24 DIAGNOSIS — Z3202 Encounter for pregnancy test, result negative: Secondary | ICD-10-CM | POA: Diagnosis not present

## 2015-08-24 DIAGNOSIS — E119 Type 2 diabetes mellitus without complications: Secondary | ICD-10-CM | POA: Insufficient documentation

## 2015-08-24 DIAGNOSIS — Z8619 Personal history of other infectious and parasitic diseases: Secondary | ICD-10-CM | POA: Diagnosis not present

## 2015-08-24 DIAGNOSIS — Z794 Long term (current) use of insulin: Secondary | ICD-10-CM | POA: Diagnosis not present

## 2015-08-24 DIAGNOSIS — Z9851 Tubal ligation status: Secondary | ICD-10-CM | POA: Insufficient documentation

## 2015-08-24 HISTORY — DX: Calculus of kidney: N20.0

## 2015-08-24 LAB — URINALYSIS, ROUTINE W REFLEX MICROSCOPIC
BILIRUBIN URINE: NEGATIVE
Glucose, UA: 100 mg/dL — AB
KETONES UR: NEGATIVE mg/dL
LEUKOCYTES UA: NEGATIVE
NITRITE: NEGATIVE
PH: 6.5 (ref 5.0–8.0)
Protein, ur: 30 mg/dL — AB
SPECIFIC GRAVITY, URINE: 1.027 (ref 1.005–1.030)
UROBILINOGEN UA: 2 mg/dL — AB (ref 0.0–1.0)

## 2015-08-24 LAB — URINE MICROSCOPIC-ADD ON

## 2015-08-24 LAB — CBC WITH DIFFERENTIAL/PLATELET
BASOS ABS: 0 10*3/uL (ref 0.0–0.1)
BASOS PCT: 0 %
EOS ABS: 0 10*3/uL (ref 0.0–0.7)
EOS PCT: 0 %
HCT: 40.5 % (ref 36.0–46.0)
HEMOGLOBIN: 14.1 g/dL (ref 12.0–15.0)
LYMPHS ABS: 1.8 10*3/uL (ref 0.7–4.0)
Lymphocytes Relative: 33 %
MCH: 28.8 pg (ref 26.0–34.0)
MCHC: 34.8 g/dL (ref 30.0–36.0)
MCV: 82.7 fL (ref 78.0–100.0)
Monocytes Absolute: 0.4 10*3/uL (ref 0.1–1.0)
Monocytes Relative: 7 %
NEUTROS PCT: 60 %
Neutro Abs: 3.2 10*3/uL (ref 1.7–7.7)
PLATELETS: 220 10*3/uL (ref 150–400)
RBC: 4.9 MIL/uL (ref 3.87–5.11)
RDW: 11.9 % (ref 11.5–15.5)
WBC: 5.5 10*3/uL (ref 4.0–10.5)

## 2015-08-24 LAB — BASIC METABOLIC PANEL
Anion gap: 7 (ref 5–15)
BUN: 12 mg/dL (ref 6–20)
CHLORIDE: 100 mmol/L — AB (ref 101–111)
CO2: 29 mmol/L (ref 22–32)
CREATININE: 0.56 mg/dL (ref 0.44–1.00)
Calcium: 8.8 mg/dL — ABNORMAL LOW (ref 8.9–10.3)
Glucose, Bld: 250 mg/dL — ABNORMAL HIGH (ref 65–99)
Potassium: 3.9 mmol/L (ref 3.5–5.1)
SODIUM: 136 mmol/L (ref 135–145)

## 2015-08-24 LAB — PREGNANCY, URINE: PREG TEST UR: NEGATIVE

## 2015-08-24 MED ORDER — TRAMADOL HCL 50 MG PO TABS: 50.0000 mg | ORAL_TABLET | Freq: Four times a day (QID) | ORAL | Status: AC | PRN

## 2015-08-24 NOTE — Discharge Instructions (Signed)
Flank Pain Flank pain refers to pain that is located on the side of the body between the upper abdomen and the back. The pain may occur over a short period of time (acute) or may be long-term or reoccurring (chronic). It may be mild or severe. Flank pain can be caused by many things. CAUSES  Some of the more common causes of flank pain include:  Muscle strains.   Muscle spasms.   A disease of your spine (vertebral disk disease).   A lung infection (pneumonia).   Fluid around your lungs (pulmonary edema).   A kidney infection.   Kidney stones.   A very painful skin rash caused by the chickenpox virus (shingles).   Gallbladder disease.  [place] care will depend on the cause of your pain. In general,  Rest as directed by your caregiver.  Drink enough fluids to keep your urine clear or pale yellow.  Only take over-the-counter or prescription medicines as directed by your caregiver. Some medicines may help relieve the pain.  Tell your caregiver about any changes in your pain.  Follow up with your caregiver as directed. SEEK IMMEDIATE MEDICAL CARE IF:   Your pain is not controlled with medicine.   You have new or worsening symptoms.  Your pain increases.   You have abdominal pain.   You have shortness of breath.   You have persistent nausea or vomiting.   You have swelling in your abdomen.   You feel faint or pass out.   You have blood in your urine.  You have a fever or persistent symptoms for more than 2-3 days.  You have a fever and your symptoms suddenly get worse. MAKE SURE YOU:   Understand these instructions.  Will watch your condition.  Will get help right away if you are not doing well or get worse. Document Released: 01/11/2006 Document Revised: 08/14/2012 Document Reviewed: 07/04/2012 [facility] Patient Information 2015 [place], [place]. This information is not intended to replace advice given to you by your  health care provider. Make sure you discuss any questions you have with your health care provider.  Hematuria Hematuria is blood in your urine. It can be caused by a bladder infection, kidney infection, prostate infection, kidney stone, or cancer of your urinary tract. Infections can usually be treated with medicine, and a kidney stone usually will pass through your urine. If neither of these is the cause of your hematuria, further workup to find out the reason may be needed. It is very important that you tell your health care provider about any blood you see in your urine, even if the blood stops without treatment or happens without causing pain. Blood in your urine that happens and then stops and then happens again can be a symptom of a very serious condition. Also, pain is not a symptom in the initial stages of many urinary cancers. HOME CARE INSTRUCTIONS   Drink lots of fluid, 3-4 quarts a day. If you have been diagnosed with an infection, cranberry juice is especially recommended, in addition to large amounts of water.  Avoid caffeine, tea, and carbonated beverages because they tend to irritate the bladder.  Avoid alcohol because it may irritate the prostate.  Take all medicines as directed by your health care provider.  If you were prescribed an antibiotic medicine, finish it all even if you start to feel better.  If you have been diagnosed with a kidney stone, follow your health care provider's instructions regarding straining  your urine to catch the stone.  Empty your bladder often. Avoid holding urine for long periods of time.  After a bowel movement, women should cleanse front to back. Use each tissue only once.  Empty your bladder before and after sexual intercourse if you are a female. SEEK MEDICAL CARE IF:  You develop back pain.  You have a fever.  You have a feeling of sickness in your stomach (nausea) or vomiting.  Your symptoms are not better in 3 days. Return sooner  if you are getting worse. SEEK IMMEDIATE MEDICAL CARE IF:   You develop severe vomiting and are unable to keep the medicine down.  You develop severe back or abdominal pain despite taking your medicines.  You begin passing a large amount of blood or clots in your urine.  You feel extremely weak or faint, or you pass out. MAKE SURE YOU:   Understand these instructions.  Will watch your condition.  Will get help right away if you are not doing well or get worse. Document Released: 11/20/2005 Document Revised: 04/06/2014 Document Reviewed: 07/21/2013 [facility] Patient Information 2015 [place], [place]. This information is not intended to replace advice given to you by your health care provider. Make sure you discuss any questions you have with your health care provider.

## 2015-08-24 NOTE — ED Notes (Signed)
Right flank pain started last night-NAD-steady gait

## 2015-08-24 NOTE — ED Provider Notes (Signed)
CSN: [id number]     Arrival date & time 08/24/15  1131 History   First MD Initiated Contact with Patient 08/24/15 1145     Chief Complaint  Patient presents with  . Flank Pain     (Consider location/radiation/quality/duration/timing/severity/associated sxs/prior Treatment) Patient is a 39 y.o. female presenting with flank pain. The history is provided by the patient.  Flank Pain Pertinent negatives include no chest pain, no abdominal pain and no shortness of breath.   patient presents with right flank pain. She has had right-sided lower abdominal pain recently was seen at [facility] and diagnosed with kidney stones. States his pain is higher up. Began last night. No dysuria. No fevers. No chills. States worse when she lays on it. No trauma. Pain is dull and constant.  Past Medical History  Diagnosis Date  . Diabetes mellitus   . Hepatitis B   . Kidney stone    Past Surgical History  Procedure Laterality Date  . Tubal ligation    . Ankle surgery     No family history on file. Social History  Substance Use Topics  . Smoking status: Never Smoker   . Smokeless tobacco: Never Used  . Alcohol Use: No   OB History    No data available     Review of Systems  Constitutional: Negative for appetite change.  Respiratory: Negative for shortness of breath.   Cardiovascular: Negative for chest pain.  Gastrointestinal: Negative for abdominal pain.  Genitourinary: Positive for flank pain.  Musculoskeletal: Negative for back pain.  Skin: Negative for wound.  Neurological: Negative for numbness.      Allergies  Review of patient's allergies indicates no known allergies.  Home Medications   Prior to Admission medications   Medication Sig Start Date End Date Taking? Authorizing Provider  insulin detemir (LEVEMIR) 100 UNIT/ML injection Inject 0.3 mLs (30 Units total) into the skin at bedtime. 08/14/13   Philemon Kingdom, MD  metFORMIN (GLUCOPHAGE) 500 MG tablet Take 2  tablets (1,000 mg total) by mouth 2 (two) times daily with a meal. 08/14/13   Philemon Kingdom, MD  traMADol (ULTRAM) 50 MG tablet Take 1 tablet (50 mg total) by mouth every 6 (six) hours as needed. 08/24/15   Davonna Belling, MD   BP 143/86 mmHg  Pulse 80  Temp(Src) 98.2 F (36.8 C) (Oral)  Resp 18  Ht 5\' 6"  (1.676 m)  Wt 210 lb (95.255 kg)  BMI 33.91 kg/m2  SpO2 100%  LMP 08/21/2015 Physical Exam  Constitutional: She appears well-developed.  HENT:  Head: Atraumatic.  Cardiovascular: Normal rate.   Pulmonary/Chest: Effort normal.  Abdominal: Bowel sounds are normal. There is no tenderness.  Genitourinary:  CVA tenderness on right side. No rash.  Neurological: She is alert.  Skin: Skin is warm.  Psychiatric: She has a normal mood and affect.    ED Course  Procedures (including critical care time) Labs Review Labs Reviewed  URINALYSIS, ROUTINE W REFLEX MICROSCOPIC (NOT AT [facility]) - Abnormal; Notable for the following:    APPearance CLOUDY (*)    Glucose, UA 100 (*)    Hgb urine dipstick LARGE (*)    Protein, ur 30 (*)    Urobilinogen, UA 2.0 (*)    All other components within normal limits  BASIC METABOLIC PANEL - Abnormal; Notable for the following:    Chloride 100 (*)    Glucose, Bld 250 (*)    Calcium 8.8 (*)    All other components within normal limits  URINE MICROSCOPIC-ADD ON - Abnormal; Notable for the following:    Bacteria, UA MANY (*)    All other components within normal limits  PREGNANCY, URINE  CBC WITH DIFFERENTIAL/PLATELET    Imaging Review US Renal  08/24/2015   CLINICAL DATA:  39 year old female with severe right flank pain and known history of right-sided renal stones  EXAM: RENAL / URINARY TRACT ULTRASOUND COMPLETE  COMPARISON:  Prior CT abdomen/ pelvis 08/13/2015  FINDINGS: Right Kidney:  Length: 11.4 cm. No evidence of hydronephrosis. Echogenicity is within normal limits. 2 mm echogenic focus noted in the right upper pole consistent with  nephrolithiasis. A second 2.5 mm stone is noted in the interpolar region.  Left Kidney:  Length: 12.1 cm. Echogenicity within normal limits. No mass or hydronephrosis visualized.  Bladder:  Under distended which slightly limits evaluation.  IMPRESSION: 1. No evidence of hydronephrosis. 2. Two punctate nonobstructing stones are visualized in the right renal collecting system.   Electronically Signed   By: Jacqulynn Cadet M.D.   On: 08/24/2015 13:51   I have personally reviewed and evaluated these images and lab results as part of my medical decision-making.   EKG Interpretation None      MDM   Final diagnoses:  Flank pain  Hematuria    Patient with flank pain. Reportedly had recent CT scan that showed a stone without hydronephrosis. Pain has returned. Has hematuria again. Ultrasound shows no hydro-nephrosis now. Will have follow-up with urology. Records from [facility] reviewed.Davonna Belling, MD 08/24/15 [phone]

## 2016-01-05 ENCOUNTER — Emergency Department (HOSPITAL_BASED_OUTPATIENT_CLINIC_OR_DEPARTMENT_OTHER): Payer: BLUE CROSS/BLUE SHIELD

## 2016-01-05 ENCOUNTER — Encounter (HOSPITAL_BASED_OUTPATIENT_CLINIC_OR_DEPARTMENT_OTHER): Payer: Self-pay | Admitting: Emergency Medicine

## 2016-01-05 ENCOUNTER — Emergency Department (HOSPITAL_BASED_OUTPATIENT_CLINIC_OR_DEPARTMENT_OTHER)
Admission: EM | Admit: 2016-01-05 | Discharge: 2016-01-05 | Disposition: A | Discharge status: Home / Self Care | Payer: BLUE CROSS/BLUE SHIELD | Attending: Emergency Medicine | Admitting: Emergency Medicine

## 2016-01-05 DIAGNOSIS — E669 Obesity, unspecified: Secondary | ICD-10-CM | POA: Diagnosis not present

## 2016-01-05 DIAGNOSIS — S8991XA Unspecified injury of right lower leg, initial encounter: Secondary | ICD-10-CM | POA: Diagnosis present

## 2016-01-05 DIAGNOSIS — Y9389 Activity, other specified: Secondary | ICD-10-CM | POA: Diagnosis not present

## 2016-01-05 DIAGNOSIS — Z794 Long term (current) use of insulin: Secondary | ICD-10-CM | POA: Insufficient documentation

## 2016-01-05 DIAGNOSIS — Z7984 Long term (current) use of oral hypoglycemic drugs: Secondary | ICD-10-CM | POA: Diagnosis not present

## 2016-01-05 DIAGNOSIS — S838X1A Sprain of other specified parts of right knee, initial encounter: Secondary | ICD-10-CM | POA: Insufficient documentation

## 2016-01-05 DIAGNOSIS — E119 Type 2 diabetes mellitus without complications: Secondary | ICD-10-CM | POA: Diagnosis not present

## 2016-01-05 DIAGNOSIS — Z87442 Personal history of urinary calculi: Secondary | ICD-10-CM | POA: Insufficient documentation

## 2016-01-05 DIAGNOSIS — Y9289 Other specified places as the place of occurrence of the external cause: Secondary | ICD-10-CM | POA: Diagnosis not present

## 2016-01-05 DIAGNOSIS — M25561 Pain in right knee: Secondary | ICD-10-CM

## 2016-01-05 DIAGNOSIS — X58XXXA Exposure to other specified factors, initial encounter: Secondary | ICD-10-CM | POA: Diagnosis not present

## 2016-01-05 DIAGNOSIS — Y998 Other external cause status: Secondary | ICD-10-CM | POA: Diagnosis not present

## 2016-01-05 DIAGNOSIS — S86811A Strain of other muscle(s) and tendon(s) at lower leg level, right leg, initial encounter: Secondary | ICD-10-CM | POA: Diagnosis not present

## 2016-01-05 DIAGNOSIS — S8391XA Sprain of unspecified site of right knee, initial encounter: Secondary | ICD-10-CM

## 2016-01-05 DIAGNOSIS — M25461 Effusion, right knee: Secondary | ICD-10-CM | POA: Insufficient documentation

## 2016-01-05 DIAGNOSIS — Z8619 Personal history of other infectious and parasitic diseases: Secondary | ICD-10-CM | POA: Insufficient documentation

## 2016-01-05 MED ORDER — HYDROCODONE-ACETAMINOPHEN 5-325 MG PO TABS: 1.0000 | ORAL_TABLET | Freq: Four times a day (QID) | ORAL | Status: DC | PRN

## 2016-01-05 MED ORDER — NAPROXEN 500 MG PO TABS: 500.0000 mg | ORAL_TABLET | Freq: Two times a day (BID) | ORAL | Status: AC | PRN

## 2016-01-05 NOTE — ED Notes (Signed)
Right knee pain x3 weeks without known injury.

## 2016-01-05 NOTE — Discharge Instructions (Signed)
Wear knee sleeve and Use crutches as needed for comfort and compression. Ice and elevate knee throughout the day. Alternate between naprosyn and norco for pain relief. Do not drive or operate machinery with pain medication use. Call orthopedic follow up today or tomorrow to schedule followup appointment for recheck of ongoing knee pain in one to two weeks that can be canceled with a 24-48 hour notice if complete resolution of pain. Return to the ER for changes or worsening symptoms   Knee Pain Knee pain is a common problem. It can have many causes. The pain often goes away by following your doctor's home care instructions. Treatment for ongoing pain will depend on the cause of your pain. If your knee pain continues, more tests may be needed to diagnose your condition. Tests may include X-rays or other imaging studies of your knee. HOME CARE  Take medicines only as told by your doctor.  Rest your knee and keep it raised (elevated) while you are resting.  Do not do things that cause pain or make your pain worse.  Avoid activities where both feet leave the ground at the same time, such as running, jumping rope, or doing jumping jacks.  Apply ice to the knee area:  Put ice in a plastic bag.  Place a towel between your skin and the bag.  Leave the ice on for 20 minutes, 2-3 times a day.  Ask your doctor if you should wear an elastic knee support.  Sleep with a pillow under your knee.  Lose weight if you are overweight. Being overweight can make your knee hurt more.  Do not use any tobacco products, including cigarettes, chewing tobacco, or electronic cigarettes. If you need help quitting, ask your doctor. Smoking may slow the healing of any bone and joint problems that you may have. GET HELP IF:  Your knee pain does not stop, it changes, or it gets worse.  You have a fever along with knee pain.  Your knee gives out or locks up.  Your knee becomes more swollen. GET HELP RIGHT AWAY  IF:   Your knee feels hot to the touch.  You have chest pain or trouble breathing.   This information is not intended to replace advice given to you by your health care provider. Make sure you discuss any questions you have with your health care provider.   Document Released: 02/16/2009 Document Revised: 12/11/2014 Document Reviewed: 01/21/2014 Elsevier Interactive Patient Education 2016 [employer]  Knee Effusion Knee effusion means that you have extra fluid in your knee. This can cause pain. Your knee may be more difficult to bend and move. HOME CARE  Use crutches as told by your doctor.  Wear a knee brace as told by your doctor.  Apply ice to the swollen area:  Put ice in a plastic bag.  Place a towel between your skin and the bag.  Leave the ice on for 20 minutes, 2-3 times per day.  Keep your knee raised (elevated) when you are sitting or lying down.  Take medicines only as told by your doctor.  Do any rehabilitation or strengthening exercises as told by your doctor.  Rest your knee as told by your doctor. You may start doing your normal activities again when your doctor says it is okay.  Keep all follow-up visits as told by your doctor. This is important. GET HELP IF:   You continue to have pain in your knee. GET HELP RIGHT AWAY IF:  You have  increased swelling or redness of your knee.  You have severe pain in your knee.  You have a fever.   This information is not intended to replace advice given to you by your health care provider. Make sure you discuss any questions you have with your health care provider.   Document Released: 12/23/2010 Document Revised: 12/11/2014 Document Reviewed: 07/06/2014 Elsevier Interactive Patient Education 2016 Elsevier Inc.  Knee Sprain A knee sprain is a tear in the strong bands of tissue that connect the bones (ligaments) of your knee. HOME CARE  Raise (elevate) your injured knee to lessen puffiness (swelling).  To  ease pain and puffiness, put ice on the injured area.  Put ice in a plastic bag.  Place a towel between your skin and the bag.  Leave the ice on for 20 minutes, 2-3 times a day.  Only take medicine as told by your doctor.  Do not leave your knee unprotected until pain and stiffness go away (usually 4-6 weeks).  If you have a cast or splint, do not get it wet. If your doctor told you to not take it off, cover it with a plastic bag when you shower or bathe. Do not swim.  Your doctor may have you do exercises to prevent or limit permanent weakness and stiffness. GET HELP RIGHT AWAY IF:   Your cast or splint becomes damaged.  Your pain gets worse.  You have a lot of pain, puffiness, or numbness below the cast or splint. MAKE SURE YOU:   Understand these instructions.  Will watch your condition.  Will get help right away if you are not doing well or get worse.   This information is not intended to replace advice given to you by your health care provider. Make sure you discuss any questions you have with your health care provider.   Document Released: 11/08/2009 Document Revised: 11/25/2013 Document Reviewed: 07/29/2013 Elsevier Interactive Patient Education 2016 Elsevier Inc.  Cryotherapy Cryotherapy is when you put ice on your injury. Ice helps lessen pain and puffiness (swelling) after an injury. Ice works the best when you start using it in the first 24 to 48 hours after an injury. HOME CARE  Put a dry or damp towel between the ice pack and your skin.  You may press gently on the ice pack.  Leave the ice on for no more than 10 to 20 minutes at a time.  Check your skin after 5 minutes to make sure your skin is okay.  Rest at least 20 minutes between ice pack uses.  Stop using ice when your skin loses feeling (numbness).  Do not use ice on someone who cannot tell you when it hurts. This includes small children and people with memory problems (dementia). GET HELP RIGHT  AWAY IF:  You have white spots on your skin.  Your skin turns blue or pale.  Your skin feels waxy or hard.  Your puffiness gets worse. MAKE SURE YOU:   Understand these instructions.  Will watch your condition.  Will get help right away if you are not doing well or get worse.   This information is not intended to replace advice given to you by your health care provider. Make sure you discuss any questions you have with your health care provider.   Document Released: 05/08/2008 Document Revised: 02/12/2012 Document Reviewed: 07/13/2011 Elsevier Interactive Patient Education [employer].

## 2016-01-05 NOTE — ED Provider Notes (Signed)
CSN: [id number]     Arrival date & time 01/05/16  1753 History   First MD Initiated Contact with Patient 01/05/16 1826     Chief Complaint  Patient presents with  . Knee Pain     (Consider location/radiation/quality/duration/timing/severity/associated sxs/prior Treatment) HPI Comments: Tamara Shannon is a 40 y.o. female with a PMHx of DM2, HepB, and nephrolithiasis, who presents to the ED with complaints of right knee pain 2 weeks with gradual onset and no known injuries. She states she works on her feet all day at [employer], and gradually noticed the pain was worsening. She described the pain is 8/10 constant throbbing and aching in the right knee, nonradiating, worse with getting up from a seated position, prolonged standing, or going up stairs. Mildly improved with ice, and unrelieved with ibuprofen and Tylenol. Associated symptoms include mild swelling. She denies any warmth, erythema, bruising, cuts or abrasions to the skin, numbness, tingling, weakness, fevers, chills, chest pain, shortness breath, abdominal pain, nausea, vomiting, diarrhea, constipation, dysuria, hematuria, or any other symptoms. She states her family has "bad knees" with +FHx of gout in her mother but no personal hx of gout.  Patient is a 40 y.o. female presenting with knee pain. The history is provided by the patient. No language interpreter was used.  Knee Pain Location:  Knee Time since incident:  2 weeks Injury: no   Knee location:  R knee Pain details:    Quality:  Aching and throbbing   Radiates to:  Does not radiate   Severity:  Moderate   Onset quality:  Gradual   Duration:  3 weeks   Timing:  Constant   Progression:  Worsening Chronicity:  New Prior injury to area:  No Relieved by:  Ice Worsened by:  Bearing weight and activity Ineffective treatments:  Acetaminophen and NSAIDs Associated symptoms: swelling   Associated symptoms: no decreased ROM, no fever, no muscle weakness, no numbness and no  tingling   Risk factors: obesity     Past Medical History  Diagnosis Date  . Diabetes mellitus   . Hepatitis B   . Kidney stone    Past Surgical History  Procedure Laterality Date  . Tubal ligation    . Ankle surgery     No family history on file. Social History  Substance Use Topics  . Smoking status: Never Smoker   . Smokeless tobacco: Never Used  . Alcohol Use: No   OB History    No data available     Review of Systems  Constitutional: Negative for fever and chills.  Respiratory: Negative for shortness of breath.   Cardiovascular: Negative for chest pain.  Gastrointestinal: Negative for nausea, vomiting, abdominal pain, diarrhea and constipation.  Genitourinary: Negative for dysuria and hematuria.  Musculoskeletal: Positive for joint swelling and arthralgias. Negative for myalgias.  Skin: Negative for color change and wound.  Allergic/Immunologic: Positive for immunocompromised state (diabetic).  Neurological: Negative for weakness and numbness.  Psychiatric/Behavioral: Negative for confusion.   10 Systems reviewed and are negative for acute change except as noted in the HPI.    Allergies  Review of patient's allergies indicates no known allergies.  Home Medications   Prior to Admission medications   Medication Sig Start Date End Date Taking? Authorizing Provider  insulin detemir (LEVEMIR) 100 UNIT/ML injection Inject 0.3 mLs (30 Units total) into the skin at bedtime. 08/14/13   Philemon Kingdom, MD  metFORMIN (GLUCOPHAGE) 500 MG tablet Take 2 tablets (1,000 mg total) by mouth 2 (  two) times daily with a meal. 08/14/13   Philemon Kingdom, MD  traMADol (ULTRAM) 50 MG tablet Take 1 tablet (50 mg total) by mouth every 6 (six) hours as needed. 08/24/15   Davonna Belling, MD   BP 143/84 mmHg  Pulse 87  Temp(Src) 98.3 F (36.8 C) (Oral)  Resp 16  Ht 5\' 7"  (1.702 m)  Wt 93.033 kg  BMI 32.12 kg/m2  SpO2 99%  LMP 12/29/2015 (Approximate) Physical Exam   Constitutional: She is oriented to person, place, and time. Vital signs are normal. She appears well-developed and well-nourished.  Non-toxic appearance. No distress.  Afebrile, nontoxic, NAD  HENT:  Head: Normocephalic and atraumatic.  Mouth/Throat: Mucous membranes are normal.  Eyes: Conjunctivae and EOM are normal. Right eye exhibits no discharge. Left eye exhibits no discharge.  Neck: Normal range of motion. Neck supple.  Cardiovascular: Normal rate and intact distal pulses.   Pulmonary/Chest: Effort normal. No respiratory distress.  Abdominal: Normal appearance. She exhibits no distension.  Musculoskeletal: Normal range of motion.       Right knee: She exhibits effusion. She exhibits normal range of motion, no ecchymosis, no deformity, no laceration, no erythema, normal alignment, no LCL laxity, normal patellar mobility and no MCL laxity. Tenderness found. Medial joint line and lateral joint line tenderness noted.  R knee with FROM intact, with mild medial and lateral joint line TTP, +effusion, no pedal edema or deformity, no bruising or erythema, no warmth, no abnormal alignment or patellar mobility, no varus/valgus laxity, neg anterior drawer test, no crepitus. Strength and sensation grossly intact, distal pulses intact, compartments soft.  Neurological: She is alert and oriented to person, place, and time. She has normal strength. No sensory deficit.  Skin: Skin is warm, dry and intact. No rash noted.  Psychiatric: She has a normal mood and affect. Her behavior is normal.  Nursing note and vitals reviewed.   ED Course  Procedures (including critical care time) Labs Review Labs Reviewed - No data to display  Imaging Review Dg Knee Complete 4 Views Right  01/05/2016  CLINICAL DATA:  Right knee pain with limited range of motion. No reported injury. EXAM: RIGHT KNEE - COMPLETE 4+ VIEW COMPARISON:  None. FINDINGS: There is no evidence of fracture, dislocation, or joint effusion. There  is no evidence of arthropathy or other focal bone abnormality. Soft tissues are unremarkable. IMPRESSION: Negative. Electronically Signed   By: Ilona Sorrel M.D.   On: 01/05/2016 19:01   I have personally reviewed and evaluated these images and lab results as part of my medical decision-making.   EKG Interpretation None      MDM   Final diagnoses:  Right knee pain  Knee sprain and strain, right, initial encounter  Knee effusion, right    40 y.o. female here with atraumatic R knee pain x2 wks. Mild effusion, no erythema or warmth, mild jointline TTP. NVI with soft compartments. Doubt septic joint or gout. Likely meniscal injury vs arthritic pain. Will obtain xray. Pt declines pain meds. Will reassess shortly   7:52 PM Xray neg. Doesn't appear to be tremendously arthritic. Discussed that it could be meniscal vs possibly gout although doubtful since it's been 2wks and exam with no erythema/warmth. Will apply knee sleeve, discussed RICE therapy. Will have her f/up with ortho in 1-2wks for ongoing management. Pain meds given. Will give crutches for comfort. I explained the diagnosis and have given explicit precautions to return to the ER including for any other new or worsening symptoms.  The patient understands and accepts the medical plan as it's been dictated and I have answered their questions. Discharge instructions concerning home care and prescriptions have been given. The patient is STABLE and is discharged to home in good condition.  BP 142/84 mmHg  Pulse 89  Temp(Src) 98.3 F (36.8 C) (Oral)  Resp 16  Ht 5\' 7"  (1.702 m)  Wt 93.033 kg  BMI 32.12 kg/m2  SpO2 100%  LMP 12/29/2015 (Approximate)  Meds ordered this encounter  Medications  . naproxen (NAPROSYN) 500 MG tablet    Sig: Take 1 tablet (500 mg total) by mouth 2 (two) times daily as needed for mild pain, moderate pain or headache (TAKE WITH MEALS.).    Dispense:  20 tablet    Refill:  0    Order Specific Question:   Supervising Provider    Answer:  MILLER, BRIAN [[id number]]  . HYDROcodone-acetaminophen (NORCO) 5-325 MG tablet    Sig: Take 1 tablet by mouth every 6 (six) hours as needed for severe pain.    Dispense:  6 tablet    Refill:  0    Order Specific Question:  Supervising Provider    Answer:  Noemi Chapel [[id number]]       Codie Hainer Camprubi-Soms, PA-C 01/05/16 1953  Tanna Furry, MD 01/08/16 [phone]

## 2016-01-05 NOTE — ED Notes (Signed)
PA at bedside.

## 2016-02-01 DIAGNOSIS — B181 Chronic viral hepatitis B without delta-agent: Secondary | ICD-10-CM | POA: Insufficient documentation

## 2016-02-01 DIAGNOSIS — E1142 Type 2 diabetes mellitus with diabetic polyneuropathy: Secondary | ICD-10-CM

## 2016-02-01 DIAGNOSIS — N181 Chronic kidney disease, stage 1: Secondary | ICD-10-CM | POA: Insufficient documentation

## 2016-02-01 DIAGNOSIS — E669 Obesity, unspecified: Secondary | ICD-10-CM | POA: Insufficient documentation

## 2016-02-01 DIAGNOSIS — E66811 Obesity, class 1: Secondary | ICD-10-CM | POA: Insufficient documentation

## 2016-02-01 DIAGNOSIS — E785 Hyperlipidemia, unspecified: Secondary | ICD-10-CM | POA: Insufficient documentation

## 2016-02-01 DIAGNOSIS — G43909 Migraine, unspecified, not intractable, without status migrainosus: Secondary | ICD-10-CM

## 2016-02-01 DIAGNOSIS — E113293 Type 2 diabetes mellitus with mild nonproliferative diabetic retinopathy without macular edema, bilateral: Secondary | ICD-10-CM | POA: Insufficient documentation

## 2016-02-01 HISTORY — DX: Hyperlipidemia, unspecified: E78.5

## 2016-02-01 HISTORY — DX: Type 2 diabetes mellitus with diabetic polyneuropathy: E11.42

## 2016-02-01 HISTORY — DX: Migraine, unspecified, not intractable, without status migrainosus: G43.909

## 2016-02-01 HISTORY — DX: Chronic kidney disease, stage 1: N18.1

## 2016-02-01 HISTORY — DX: Obesity, class 1: E66.811

## 2016-02-01 HISTORY — DX: Obesity, unspecified: E66.9

## 2016-02-01 HISTORY — DX: Chronic viral hepatitis B without delta-agent: B18.1

## 2016-02-01 HISTORY — DX: Type 2 diabetes mellitus with mild nonproliferative diabetic retinopathy without macular edema, bilateral: E11.3293

## 2016-08-24 DIAGNOSIS — R030 Elevated blood-pressure reading, without diagnosis of hypertension: Secondary | ICD-10-CM | POA: Insufficient documentation

## 2016-08-24 HISTORY — DX: Elevated blood-pressure reading, without diagnosis of hypertension: R03.0

## 2016-09-09 ENCOUNTER — Emergency Department (HOSPITAL_BASED_OUTPATIENT_CLINIC_OR_DEPARTMENT_OTHER)
Admission: EM | Admit: 2016-09-09 | Discharge: 2016-09-09 | Disposition: A | Discharge status: Home / Self Care | Payer: BLUE CROSS/BLUE SHIELD | Attending: Emergency Medicine | Admitting: Emergency Medicine

## 2016-09-09 ENCOUNTER — Encounter (HOSPITAL_BASED_OUTPATIENT_CLINIC_OR_DEPARTMENT_OTHER): Payer: Self-pay | Admitting: Emergency Medicine

## 2016-09-09 ENCOUNTER — Emergency Department (HOSPITAL_BASED_OUTPATIENT_CLINIC_OR_DEPARTMENT_OTHER): Payer: BLUE CROSS/BLUE SHIELD

## 2016-09-09 DIAGNOSIS — L97512 Non-pressure chronic ulcer of other part of right foot with fat layer exposed: Secondary | ICD-10-CM | POA: Insufficient documentation

## 2016-09-09 DIAGNOSIS — Z7984 Long term (current) use of oral hypoglycemic drugs: Secondary | ICD-10-CM | POA: Diagnosis not present

## 2016-09-09 DIAGNOSIS — Z794 Long term (current) use of insulin: Secondary | ICD-10-CM | POA: Insufficient documentation

## 2016-09-09 DIAGNOSIS — E11621 Type 2 diabetes mellitus with foot ulcer: Secondary | ICD-10-CM | POA: Diagnosis not present

## 2016-09-09 DIAGNOSIS — M79671 Pain in right foot: Secondary | ICD-10-CM | POA: Diagnosis present

## 2016-09-09 DIAGNOSIS — R52 Pain, unspecified: Secondary | ICD-10-CM

## 2016-09-09 MED ORDER — HYDROCODONE-ACETAMINOPHEN 5-325 MG PO TABS
1.0000 | ORAL_TABLET | Freq: Once | ORAL | Status: AC
  Administered 2016-09-09: 1 via ORAL
  Filled 2016-09-09: qty 1

## 2016-09-09 MED ORDER — HYDROCODONE-ACETAMINOPHEN 5-325 MG PO TABS: 1.0000 | ORAL_TABLET | Freq: Four times a day (QID) | ORAL | 0 refills | Status: AC | PRN

## 2016-09-09 MED ORDER — DOXYCYCLINE HYCLATE 100 MG PO CAPS
100.0000 mg | ORAL_CAPSULE | Freq: Two times a day (BID) | ORAL | 0 refills | Status: AC
Start: 2016-09-09 — End: ?

## 2016-09-09 NOTE — ED Notes (Signed)
Pt made aware to return if symptoms worsen or if any life threatening symptoms occur.   

## 2016-09-09 NOTE — ED Provider Notes (Signed)
[facility] Provider Note   CSN: [id number] Arrival date & time: 09/09/16  1335  By signing my name below, I, Neta Mends, attest that this documentation has been prepared under the direction and in the presence of Blanchie Dessert, MD . Electronically Signed: Neta Mends, ED Scribe. 09/09/2016. 3:59 PM.   History   Chief Complaint Chief Complaint  Patient presents with  . Foot Pain    The history is provided by the patient. No language interpreter was used.   HPI Comments:  Tamara Shannon is a 40 y.o. female with PMHx of DM and kidney stones who presents to the Emergency Department complaining of constant, radiating pain to her right great toe that has been present "for a while." Pt states that the pain radiates to her right ankle. Pt notes that the pain worsened 1 week ago, and also notes 2 sores on her right great toe. 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 Pt states that her grandson fell on to her foot which worsened the pain. Pt has taken gabapentin and ibuprofen with mild relief. Pt denies fever.   Past Medical History:  Diagnosis Date  . Diabetes mellitus   . Hepatitis B   . Kidney stone     Patient Active Problem List   Diagnosis Date Noted  . Type II or unspecified type diabetes mellitus with neurological manifestations, uncontrolled(250.62) 08/14/2013    Past Surgical History:  Procedure Laterality Date  . ANKLE SURGERY    . TUBAL LIGATION      OB History    No data available       Home Medications    Prior to Admission medications   Medication Sig Start Date End Date Taking? Authorizing Provider  HYDROcodone-acetaminophen (NORCO) 5-325 MG tablet Take 1 tablet by mouth every 6 (six) hours as needed for severe pain. 01/05/16   Mercedes Camprubi-Soms, PA-C  insulin detemir (LEVEMIR) 100 UNIT/ML injection Inject 0.3 mLs (30 Units total) into the skin at bedtime. 08/14/13   Philemon Kingdom, MD  metFORMIN (GLUCOPHAGE)  500 MG tablet Take 2 tablets (1,000 mg total) by mouth 2 (two) times daily with a meal. 08/14/13   Philemon Kingdom, MD  naproxen (NAPROSYN) 500 MG tablet Take 1 tablet (500 mg total) by mouth 2 (two) times daily as needed for mild pain, moderate pain or headache (TAKE WITH MEALS.). 01/05/16   Mercedes Camprubi-Soms, PA-C  traMADol (ULTRAM) 50 MG tablet Take 1 tablet (50 mg total) by mouth every 6 (six) hours as needed. 08/24/15   Davonna Belling, MD    Family History History reviewed. No pertinent family history.  Social History Social History  Substance Use Topics  . Smoking status: Never Smoker  . Smokeless tobacco: Never Used  . Alcohol use No     Allergies   Review of patient's allergies indicates no known allergies.   Review of Systems Review of Systems  Constitutional: Negative for fever.  Musculoskeletal: Positive for arthralgias.  Skin: Positive for wound.  All other systems reviewed and are negative.    Physical Exam Updated Vital Signs BP 129/81   Pulse 94   Temp 98.4 F (36.9 C)   Resp 20   Ht 5\' 7"  (1.702 m)   Wt 211 lb (95.7 kg)   LMP 09/03/2016   SpO2 100%   BMI 33.05 kg/m   Physical Exam  Constitutional: She appears well-developed and well-nourished. No distress.  HENT:  Head: Normocephalic and atraumatic.  Eyes: Conjunctivae  are normal.  Cardiovascular: Normal rate.   Pulmonary/Chest: Effort normal.  Abdominal: She exhibits no distension.  Musculoskeletal:  No calf tenderness or swelling.   Neurological: She is alert.  Skin: Skin is warm and dry.  Right great toe, plantar surface 1x1cm ulcerated lesion with surrounding intact blister and a thick tender callous on the lateral portion of toe. Minimal swelling, no red streaking.   Psychiatric: She has a normal mood and affect.  Nursing note and vitals reviewed.    ED Treatments / Results  DIAGNOSTIC STUDIES:  Oxygen Saturation is 100% on RA, normal by my interpretation.    COORDINATION OF  CARE:  3:59 PM Discussed treatment plan with pt at bedside and pt agreed to plan.   Labs (all labs ordered are listed, but only abnormal results are displayed) Labs Reviewed - No data to display  EKG  EKG Interpretation None       Radiology Dg Toe Great Right  Result Date: 09/09/2016 CLINICAL DATA:  Pt in c/o RIGHT great toe pain x "a while" but states it was worse this week. NKI. States pain originates in big toe and radiates up to ankle. Pt is diabetic and has neuropathy in that foot per pt EXAM: RIGHT GREAT TOE COMPARISON:  None. FINDINGS: No fracture.  No bone lesion. Joints are normally spaced and aligned.  No arthropathic change. Soft tissues are unremarkable. IMPRESSION: 1. No fracture, bone lesion or joint abnormality. Electronically Signed   By: Lajean Manes M.D.   On: 09/09/2016 16:22    Procedures Procedures (including critical care time)  Medications Ordered in ED Medications - No data to display   Initial Impression / Assessment and Plan / ED Course  I have reviewed the triage vital signs and the nursing notes.  Pertinent labs & imaging results that were available during my care of the patient were reviewed by me and considered in my medical decision making (see chart for details).  Clinical Course    Patient is a 40 year old diabetic female presenting today with diabetic ulcer on the plantar surface of the right great toe. No drainage at this time but surrounding blister and mild erythema. No streaking redness up the foot. X-ray negative for osteomyelitis. Patient started on doxycycline and pain medication. Discussed with her following up with wound care podiatry for further wound management.  Final Clinical Impressions(s) / ED Diagnoses   Final diagnoses:  Pain  Diabetic ulcer of toe of right foot associated with type 2 diabetes mellitus, with fat layer exposed ([place])    New Prescriptions Discharge Medication List as of 09/09/2016  4:30 PM    START  taking these medications   Details  doxycycline (VIBRAMYCIN) 100 MG capsule Take 1 capsule (100 mg total) by mouth 2 (two) times daily., Starting Sat 09/09/2016, Print       I personally performed the services described in this documentation, which was scribed in my presence.  The recorded information has been reviewed and considered.     Blanchie Dessert, MD 09/09/16 1718

## 2016-09-09 NOTE — ED Triage Notes (Signed)
Pt in c/o R foot x "a while". States pain originates in big toe and radiates up to ankle. Pt is diabetic. Alert, interactive, ambulatory in NAD.

## 2016-09-09 NOTE — ED Notes (Signed)
Patient transported to X-ray 

## 2016-11-30 DIAGNOSIS — L2084 Intrinsic (allergic) eczema: Secondary | ICD-10-CM | POA: Insufficient documentation

## 2016-11-30 HISTORY — DX: Intrinsic (allergic) eczema: L20.84

## 2017-05-09 ENCOUNTER — Emergency Department (HOSPITAL_BASED_OUTPATIENT_CLINIC_OR_DEPARTMENT_OTHER): Payer: BLUE CROSS/BLUE SHIELD

## 2017-05-09 ENCOUNTER — Encounter (HOSPITAL_BASED_OUTPATIENT_CLINIC_OR_DEPARTMENT_OTHER): Payer: Self-pay | Admitting: *Deleted

## 2017-05-09 ENCOUNTER — Emergency Department (HOSPITAL_BASED_OUTPATIENT_CLINIC_OR_DEPARTMENT_OTHER)
Admission: EM | Admit: 2017-05-09 | Discharge: 2017-05-09 | Disposition: A | Discharge status: Home / Self Care | Payer: BLUE CROSS/BLUE SHIELD | Attending: Emergency Medicine | Admitting: Emergency Medicine

## 2017-05-09 DIAGNOSIS — S93602A Unspecified sprain of left foot, initial encounter: Secondary | ICD-10-CM

## 2017-05-09 DIAGNOSIS — Y999 Unspecified external cause status: Secondary | ICD-10-CM | POA: Diagnosis not present

## 2017-05-09 DIAGNOSIS — Y929 Unspecified place or not applicable: Secondary | ICD-10-CM | POA: Insufficient documentation

## 2017-05-09 DIAGNOSIS — Z79899 Other long term (current) drug therapy: Secondary | ICD-10-CM | POA: Insufficient documentation

## 2017-05-09 DIAGNOSIS — Y939 Activity, unspecified: Secondary | ICD-10-CM | POA: Insufficient documentation

## 2017-05-09 DIAGNOSIS — Z794 Long term (current) use of insulin: Secondary | ICD-10-CM | POA: Insufficient documentation

## 2017-05-09 DIAGNOSIS — W010XXA Fall on same level from slipping, tripping and stumbling without subsequent striking against object, initial encounter: Secondary | ICD-10-CM | POA: Diagnosis not present

## 2017-05-09 DIAGNOSIS — R030 Elevated blood-pressure reading, without diagnosis of hypertension: Secondary | ICD-10-CM | POA: Insufficient documentation

## 2017-05-09 DIAGNOSIS — S93402A Sprain of unspecified ligament of left ankle, initial encounter: Secondary | ICD-10-CM | POA: Diagnosis not present

## 2017-05-09 DIAGNOSIS — Z87891 Personal history of nicotine dependence: Secondary | ICD-10-CM | POA: Diagnosis not present

## 2017-05-09 DIAGNOSIS — S99912A Unspecified injury of left ankle, initial encounter: Secondary | ICD-10-CM | POA: Diagnosis present

## 2017-05-09 DIAGNOSIS — E119 Type 2 diabetes mellitus without complications: Secondary | ICD-10-CM | POA: Insufficient documentation

## 2017-05-09 MED ORDER — ACETAMINOPHEN 500 MG PO TABS
1000.0000 mg | ORAL_TABLET | Freq: Once | ORAL | Status: AC
  Administered 2017-05-09: 1000 mg via ORAL
  Filled 2017-05-09: qty 2

## 2017-05-09 NOTE — ED Provider Notes (Signed)
[facility] Provider Note   CSN: [id number] Arrival date & time: 05/09/17  0745     History   Chief Complaint Chief Complaint  Patient presents with  . Ankle Injury    HPI Tamara Shannon is a 41 y.o. female.Complains of left proximal foot pain after she forcibly inverted her left ankle tripping over a wood block last night. No other injury. Treated with ibuprofen without relief. Pain is worse with weightbearing and improved with nonweightbearing. No other associated symptoms.  HPI  Past Medical History:  Diagnosis Date  . Diabetes mellitus   . Hepatitis B   . Kidney stone     Patient Active Problem List   Diagnosis Date Noted  . Type II or unspecified type diabetes mellitus with neurological manifestations, uncontrolled(250.62) 08/14/2013    Past Surgical History:  Procedure Laterality Date  . ANKLE SURGERY    . KNEE SURGERY Right   . TUBAL LIGATION      OB History    No data available       Home Medications    Prior to Admission medications   Medication Sig Start Date End Date Taking? Authorizing Provider  GABAPENTIN PO Take by mouth.   Yes [provider]  insulin detemir (LEVEMIR) 100 UNIT/ML injection Inject 0.3 mLs (30 Units total) into the skin at bedtime. 08/14/13  Yes Philemon Kingdom, MD  metFORMIN (GLUCOPHAGE) 500 MG tablet Take 2 tablets (1,000 mg total) by mouth 2 (two) times daily with a meal. 08/14/13  Yes Philemon Kingdom, MD  UNKNOWN TO PATIENT    Yes [provider]  doxycycline (VIBRAMYCIN) 100 MG capsule Take 1 capsule (100 mg total) by mouth 2 (two) times daily. 09/09/16   Blanchie Dessert, MD  HYDROcodone-acetaminophen (NORCO/VICODIN) 5-325 MG tablet Take 1-2 tablets by mouth every 6 (six) hours as needed. 09/09/16   Blanchie Dessert, MD  naproxen (NAPROSYN) 500 MG tablet Take 1 tablet (500 mg total) by mouth 2 (two) times daily as needed for mild pain, moderate pain or headache (TAKE WITH MEALS.). 01/05/16    Street, [place], PA-C  traMADol (ULTRAM) 50 MG tablet Take 1 tablet (50 mg total) by mouth every 6 (six) hours as needed. 08/24/15   Davonna Belling, MD    Family History No family history on file.  Social History Social History  Substance Use Topics  . Smoking status: Former Research scientist (life sciences)  . Smokeless tobacco: Never Used  . Alcohol use No     Allergies   Patient has no known allergies.   Review of Systems Review of Systems  Constitutional: Negative.   Musculoskeletal: Positive for arthralgias.       Left Foot pain  Allergic/Immunologic: Positive for immunocompromised state.       Diabetic     Physical Exam Updated Vital Signs BP (!) 140/98 (BP Location: Right Arm)   Pulse 89   Temp 98.4 F (36.9 C) (Oral)   Resp 18   Ht 5\' 6"  (1.676 m)   Wt 94.3 kg (208 lb)   LMP 04/16/2017 (Exact Date)   SpO2 100%   BMI 33.57 kg/m   Physical Exam  Constitutional: She appears well-developed and well-nourished.  HENT:  Head: Normocephalic and atraumatic.  Eyes: EOM are normal.  Neck: Neck supple.  Cardiovascular: Normal rate.   Pulmonary/Chest: Effort normal.  Abdominal: She exhibits no distension.  Musculoskeletal: Normal range of motion. She exhibits edema and tenderness.  Left lower extremity skin intact. Mild soft tissue swelling and tenderness immediately anterior  and inferior to lateral malleolus and proximal foot. Ankle is nontender. She is nontender over proximal fibula. DP pulse 2+ good capillary refill. All other extremities without abrasion or swelling or tenderness. Neurovascular intact  Neurological: She is alert. Coordination normal.  Skin: Skin is warm and dry. No rash noted.  Psychiatric: She has a normal mood and affect.  Nursing note and vitals reviewed.    ED Treatments / Results  Labs (all labs ordered are listed, but only abnormal results are displayed) Labs Reviewed - No data to display  EKG  EKG Interpretation None       Radiology Dg Foot  Complete Left  Result Date: 05/09/2017 CLINICAL DATA:  Left foot injury. EXAM: LEFT FOOT - COMPLETE 3+ VIEW COMPARISON:  No recent. FINDINGS: No acute bony or joint abnormality identified. No evidence of fracture or dislocation. IMPRESSION: No acute abnormality identified. Electronically Signed   By: Marcello Moores  Register   On: 05/09/2017 08:52    Procedures Procedures (including critical care time)  Medications Ordered in ED Medications  acetaminophen (TYLENOL) tablet 1,000 mg (not administered)   X-ray viewed by me   Initial Impression / Assessment and Plan / ED Course  I have reviewed the triage vital signs and the nursing notes.  Pertinent labs & imaging results that were available during my care of the patient were reviewed by me and considered in my medical decision making (see chart for details). ASO splint placed on patient by technician, is comfortable for patient. She has crutches at home.    In light of patient's history of hepatitis B suggested Advil as needed for pain. Plan Advil, crutches, ice and elevation. Referral Dr.Hudnall if significant pain or difficulty walking in a week. Blood pressure recheck 3 weeks Final Clinical Impressions(s) / ED Diagnoses  Dx #1 left foot sprain #2 elevated blood pressure Final diagnoses:  None    New Prescriptions New Prescriptions   No medications on file     Orlie Dakin, MD 05/09/17 [phone]

## 2017-05-09 NOTE — Discharge Instructions (Signed)
Wear the splint as needed for comfort. Take Advil as directed for pain. Call Dr.Hudnall to schedule an office visit if having significant pain or difficulty walking in a week. Use your crutches to avoid weightbearing. Get your blood pressure recheck within the next 3 weeks. Today's was elevated at 140/98

## 2017-05-09 NOTE — ED Triage Notes (Signed)
Pt reports twisting L foot last night. Pt reports being able to bear some weight on foot.

## 2017-06-19 DIAGNOSIS — J4530 Mild persistent asthma, uncomplicated: Secondary | ICD-10-CM

## 2017-06-19 HISTORY — DX: Mild persistent asthma, uncomplicated: J45.30

## 2017-07-27 DIAGNOSIS — M19071 Primary osteoarthritis, right ankle and foot: Secondary | ICD-10-CM | POA: Insufficient documentation

## 2017-07-27 HISTORY — DX: Primary osteoarthritis, right ankle and foot: M19.071

## 2017-09-13 DIAGNOSIS — D1803 Hemangioma of intra-abdominal structures: Secondary | ICD-10-CM

## 2017-09-13 HISTORY — DX: Hemangioma of intra-abdominal structures: D18.03

## 2017-12-18 DIAGNOSIS — E11610 Type 2 diabetes mellitus with diabetic neuropathic arthropathy: Secondary | ICD-10-CM | POA: Insufficient documentation

## 2017-12-18 DIAGNOSIS — M6701 Short Achilles tendon (acquired), right ankle: Secondary | ICD-10-CM

## 2017-12-18 HISTORY — DX: Short Achilles tendon (acquired), right ankle: M67.01

## 2017-12-18 HISTORY — DX: Type 2 diabetes mellitus with diabetic neuropathic arthropathy: E11.610

## 2018-03-11 DIAGNOSIS — E113293 Type 2 diabetes mellitus with mild nonproliferative diabetic retinopathy without macular edema, bilateral: Secondary | ICD-10-CM | POA: Insufficient documentation

## 2018-03-11 DIAGNOSIS — H16223 Keratoconjunctivitis sicca, not specified as Sjogren's, bilateral: Secondary | ICD-10-CM

## 2018-03-11 DIAGNOSIS — H524 Presbyopia: Secondary | ICD-10-CM

## 2018-03-11 DIAGNOSIS — Z794 Long term (current) use of insulin: Secondary | ICD-10-CM | POA: Insufficient documentation

## 2018-03-11 DIAGNOSIS — H43393 Other vitreous opacities, bilateral: Secondary | ICD-10-CM | POA: Insufficient documentation

## 2018-03-11 HISTORY — DX: Keratoconjunctivitis sicca, not specified as Sjogren's, bilateral: H16.223

## 2018-03-11 HISTORY — DX: Long term (current) use of insulin: Z79.4

## 2018-03-11 HISTORY — DX: Type 2 diabetes mellitus with mild nonproliferative diabetic retinopathy without macular edema, bilateral: E11.3293

## 2018-03-11 HISTORY — DX: Other vitreous opacities, bilateral: H43.393

## 2018-03-11 HISTORY — DX: Presbyopia: H52.4

## 2018-08-25 IMAGING — CR DG FOOT COMPLETE 3+V*L*
3 series · 3 of 3 positions shown · non-contrast
Comparison: No recent.

CLINICAL DATA: Left foot injury.

EXAM:
LEFT FOOT - COMPLETE 3+ VIEW

[t foot ap left]
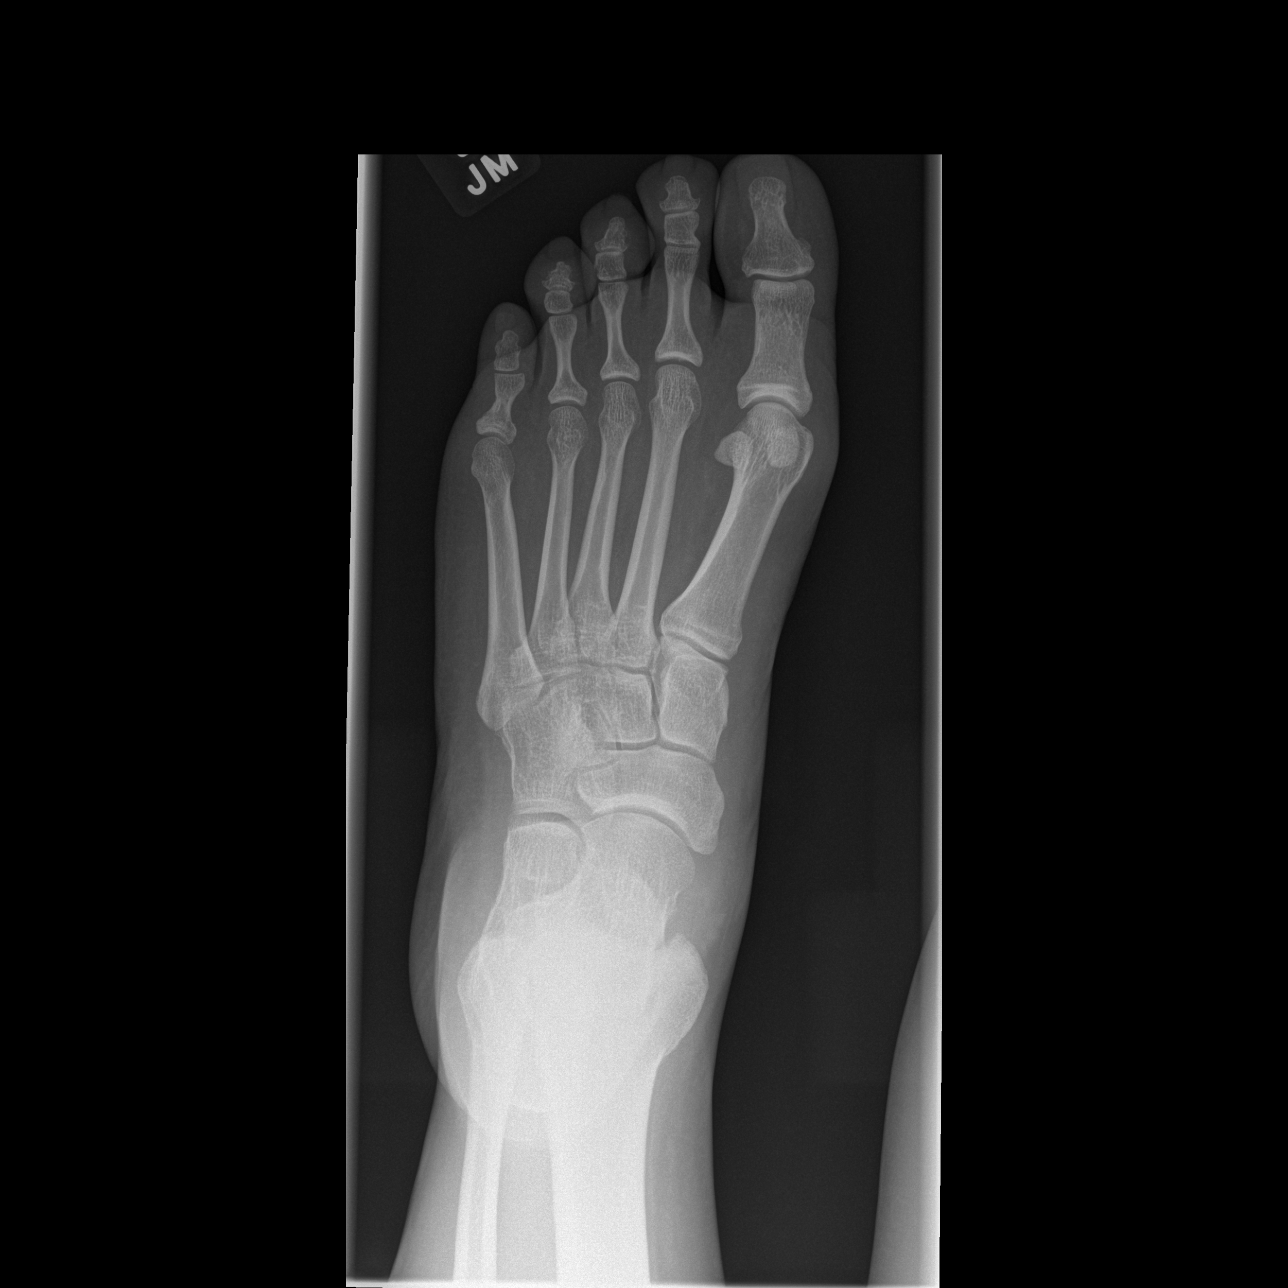

[t foot oblique left]
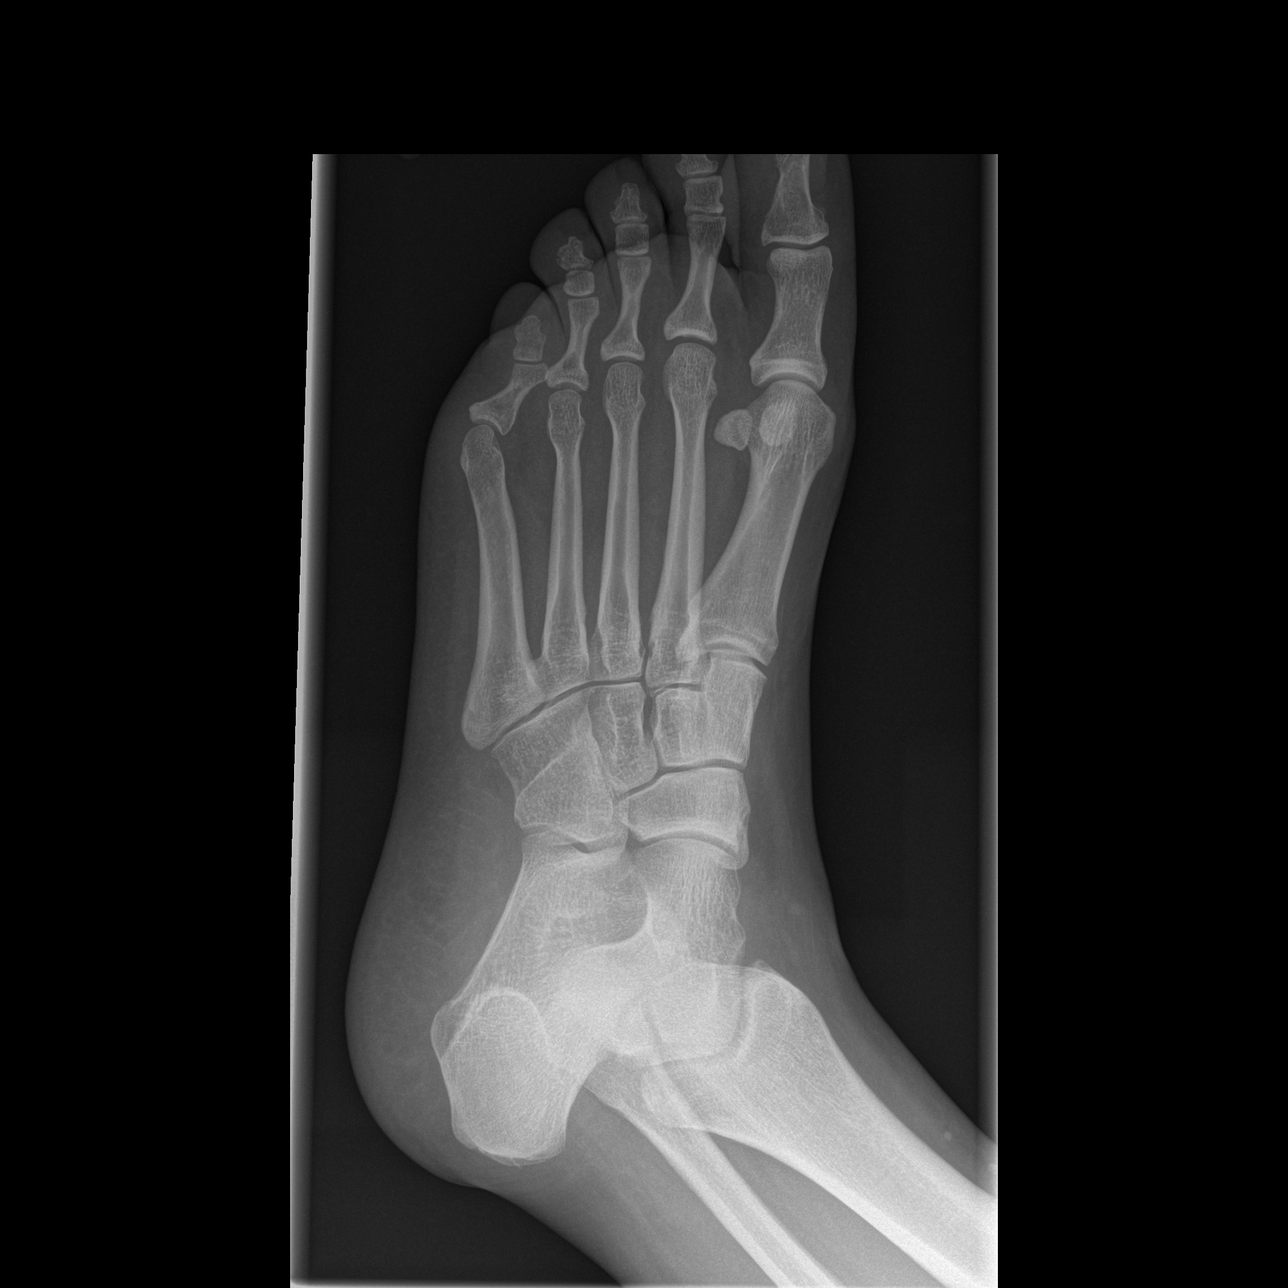

[t foot lat left]
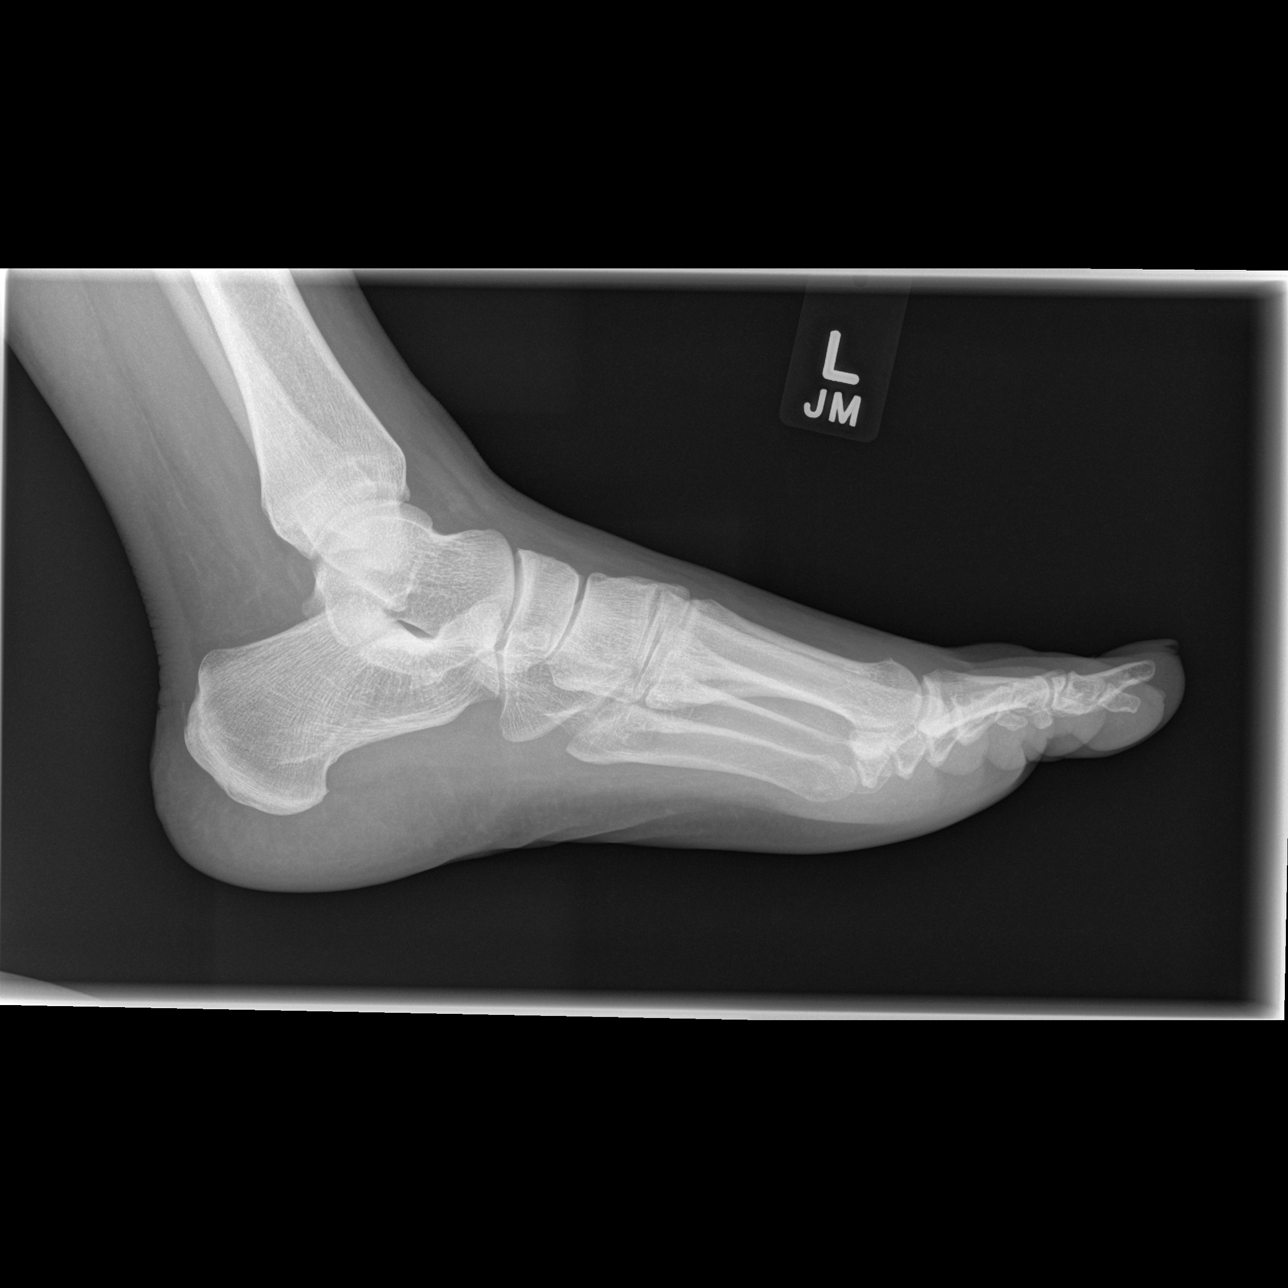

[3 of 3 positions shown; findings below may reference images not displayed]

FINDINGS: No acute bony or joint abnormality identified. No evidence of
fracture or dislocation.
IMPRESSION: No acute abnormality identified.

## 2019-01-08 DIAGNOSIS — M21961 Unspecified acquired deformity of right lower leg: Secondary | ICD-10-CM | POA: Insufficient documentation

## 2019-01-08 HISTORY — DX: Unspecified acquired deformity of right lower leg: M21.961

## 2020-08-30 ENCOUNTER — Encounter (HOSPITAL_BASED_OUTPATIENT_CLINIC_OR_DEPARTMENT_OTHER): Payer: Self-pay | Admitting: *Deleted

## 2020-08-30 ENCOUNTER — Emergency Department (HOSPITAL_BASED_OUTPATIENT_CLINIC_OR_DEPARTMENT_OTHER): Payer: BC Managed Care – PPO

## 2020-08-30 ENCOUNTER — Other Ambulatory Visit: Payer: Self-pay

## 2020-08-30 ENCOUNTER — Emergency Department (HOSPITAL_BASED_OUTPATIENT_CLINIC_OR_DEPARTMENT_OTHER)
Admission: EM | Admit: 2020-08-30 | Discharge: 2020-08-30 | Disposition: A | Discharge status: Home / Self Care | Payer: BC Managed Care – PPO | Attending: Emergency Medicine | Admitting: Emergency Medicine

## 2020-08-30 DIAGNOSIS — Z87891 Personal history of nicotine dependence: Secondary | ICD-10-CM | POA: Insufficient documentation

## 2020-08-30 DIAGNOSIS — R072 Precordial pain: Secondary | ICD-10-CM | POA: Diagnosis not present

## 2020-08-30 DIAGNOSIS — E119 Type 2 diabetes mellitus without complications: Secondary | ICD-10-CM | POA: Insufficient documentation

## 2020-08-30 LAB — BASIC METABOLIC PANEL
Anion gap: 11 (ref 5–15)
BUN: 17 mg/dL (ref 6–20)
CO2: 26 mmol/L (ref 22–32)
Calcium: 8.6 mg/dL — ABNORMAL LOW (ref 8.9–10.3)
Chloride: 100 mmol/L (ref 98–111)
Creatinine, Ser: 0.68 mg/dL (ref 0.44–1.00)
GFR calc Af Amer: >60 mL/min (ref >60)
GFR calc non Af Amer: >60 mL/min (ref >60)
Glucose, Bld: 168 mg/dL — ABNORMAL HIGH (ref 70–99)
Potassium: 3.7 mmol/L (ref 3.5–5.1)
Sodium: 137 mmol/L (ref 135–145)

## 2020-08-30 LAB — CBC
HCT: 41.6 % (ref 36.0–46.0)
Hemoglobin: 13.9 g/dL (ref 12.0–15.0)
MCH: 29.3 pg (ref 26.0–34.0)
MCHC: 33.4 g/dL (ref 30.0–36.0)
MCV: 87.6 fL (ref 80.0–100.0)
Platelets: 229 10*3/uL (ref 150–400)
RBC: 4.75 MIL/uL (ref 3.87–5.11)
RDW: 12.3 % (ref 11.5–15.5)
WBC: 6.6 10*3/uL (ref 4.0–10.5)
nRBC: 0 % (ref 0.0–0.2)

## 2020-08-30 LAB — TROPONIN I (HIGH SENSITIVITY)
Troponin I (High Sensitivity): 3 ng/L (ref <18)
Troponin I (High Sensitivity): 3 ng/L (ref <18)

## 2020-08-30 LAB — PREGNANCY, URINE: Preg Test, Ur: NEGATIVE

## 2020-08-30 MED ORDER — ALUM & MAG HYDROXIDE-SIMETH 200-200-20 MG/5ML PO SUSP
15.0000 mL | Freq: Once | ORAL | Status: AC
  Administered 2020-08-30: 15 mL via ORAL
  Filled 2020-08-30: qty 30

## 2020-08-30 MED ORDER — POLYETHYLENE GLYCOL 3350 17 GM/SCOOP PO POWD: 1.0000 | Freq: Every day | ORAL | 0 refills | Status: AC | PRN

## 2020-08-30 MED ORDER — PANTOPRAZOLE SODIUM 40 MG PO TBEC: 40.0000 mg | DELAYED_RELEASE_TABLET | Freq: Every day | ORAL | 0 refills | Status: AC

## 2020-08-30 MED ORDER — ALUM & MAG HYDROXIDE-SIMETH 400-400-40 MG/5ML PO SUSP: 10.0000 mL | Freq: Four times a day (QID) | ORAL | 0 refills | Status: AC | PRN

## 2020-08-30 MED ORDER — KETOROLAC TROMETHAMINE 30 MG/ML IJ SOLN
30.0000 mg | Freq: Once | INTRAMUSCULAR | Status: AC
  Administered 2020-08-30: 30 mg via INTRAVENOUS
  Filled 2020-08-30: qty 1

## 2020-08-30 MED FILL — SM CLEARLAX POWDER: 17 | 1 days supply | Qty: 255 | Fill #0

## 2020-08-30 MED FILL — PANTOPRAZOLE SOD DR 40 MG T: 40 | 30 days supply | Qty: 30 | Fill #0

## 2020-08-30 NOTE — Discharge Instructions (Signed)
You were seen in the emergency room today with chest discomfort.  Your labs did not show evidence of a heart attack.  I'm starting on several medications to help with symptoms but would like for you to follow closely with your primary care doctor.  Please call them today to schedule the next available appointment.  I have also placed a referral to the cardiology service.  They should be calling you for an appointment in the coming days.   If you develop new or suddenly worsening symptoms such as severe chest pain, sweating, nausea, shortness of breath please return to the emergency department immediately and/or call 911.

## 2020-08-30 NOTE — ED Triage Notes (Signed)
C/o anterior chest pain that  Started 2 days ago. States pain comes and goes. Describes as both sharp and dull. Denies any fevers, cough, or congestion. States she has taken tums  Without relief. Denies any sob.

## 2020-08-30 NOTE — ED Provider Notes (Signed)
Emergency Department Provider Note   I have reviewed the triage vital signs and the nursing notes.   HISTORY  Chief Complaint Chest Pain   HPI Tamara Shannon is a 44 y.o. female presents to the emergency department for evaluation of anterior chest pain which began intermittently over the past 2 days.  She describes the pain as a central pressure without radiation.  Pain has still but also occasional sharp components.  She is not feeling short of breath, having diaphoresis, nausea.  Denies any fever or flulike symptoms.  She states that today the pain has been more constant which prompted her ED visit.  She is currently having some chest discomfort.  No clear modifying factors.   Past Medical History:  Diagnosis Date  . Diabetes mellitus   . Hepatitis B   . Kidney stone     Patient Active Problem List   Diagnosis Date Noted  . Type II or unspecified type diabetes mellitus with neurological manifestations, uncontrolled(250.62) 08/14/2013    Past Surgical History:  Procedure Laterality Date  . ANKLE SURGERY    . KNEE SURGERY Right   . TUBAL LIGATION      Allergies Hydrocodone and Latex  No family history on file.  Social History Social History   Tobacco Use  . Smoking status: Former Research scientist (life sciences)  . Smokeless tobacco: Never Used  Substance Use Topics  . Alcohol use: No  . Drug use: No    Review of Systems  Constitutional: No fever/chills Eyes: No visual changes. ENT: No sore throat. Cardiovascular: Positive chest pain. Respiratory: Denies shortness of breath. Gastrointestinal: No abdominal pain.  No nausea, no vomiting.  No diarrhea.  No constipation. Genitourinary: Negative for dysuria. Musculoskeletal: Negative for back pain. Skin: Negative for rash. Neurological: Negative for headaches, focal weakness or numbness.  10-point ROS otherwise negative.  ____________________________________________   PHYSICAL EXAM:  VITAL SIGNS: ED Triage Vitals  Enc  Vitals Group     BP 08/30/20 0650 (!) 171/105     Pulse Rate 08/30/20 0650 87     Resp 08/30/20 0650 12     Temp 08/30/20 0650 98.3 F (36.8 C)     Temp Source 08/30/20 0650 Oral     SpO2 08/30/20 0650 100 %     Weight 08/30/20 0650 201 lb (91.2 kg)     Height 08/30/20 0650 5\' 6"  (1.676 m)   Constitutional: Alert and oriented. Well appearing and in no acute distress. Eyes: Conjunctivae are normal. Head: Atraumatic. Nose: No congestion/rhinnorhea. Neck: No stridor.   Cardiovascular: Normal rate, regular rhythm. Good peripheral circulation. Grossly normal heart sounds.   Respiratory: Normal respiratory effort.  No retractions. Lungs CTAB. Gastrointestinal: Soft and nontender. No distention.  Musculoskeletal: No gross deformities of extremities. Neurologic:  Normal speech and language.  Skin:  Skin is warm, dry and intact. No rash noted.  ____________________________________________   LABS (all labs ordered are listed, but only abnormal results are displayed)  Labs Reviewed  BASIC METABOLIC PANEL - Abnormal; Notable for the following components:      Result Value   Glucose, Bld 168 (*)    Calcium 8.6 (*)    All other components within normal limits  CBC  PREGNANCY, URINE  TROPONIN I (HIGH SENSITIVITY)  TROPONIN I (HIGH SENSITIVITY)   ____________________________________________  EKG   EKG Interpretation  Date/Time:  Monday August 30 2020 06:48:32 EDT Ventricular Rate:  86 PR Interval:    QRS Duration: 84 QT Interval:  370 QTC Calculation: 443  R Axis:   34 Text Interpretation: Sinus rhythm Baseline wander in lead(s) II III aVF No STEMI Confirmed by Nanda Quinton [phone]) on 08/30/2020 7:02:27 AM       ____________________________________________  RADIOLOGY  DG Chest 2 View  Result Date: 08/30/2020 CLINICAL DATA:  Chest pain. EXAM: CHEST - 2 VIEW COMPARISON:  Chest x-ray report 11/07/2013. FINDINGS: Mediastinum hilar structures normal. Heart size normal. No  focal infiltrate. No pleural effusion or pneumothorax. No acute bony abnormality. IMPRESSION: No acute cardiopulmonary disease. Electronically Signed   By: Marcello Moores  Register   On: 08/30/2020 07:12    ____________________________________________   PROCEDURES  Procedure(s) performed:   Procedures  None  ____________________________________________   INITIAL IMPRESSION / ASSESSMENT AND PLAN / ED COURSE  Pertinent labs & imaging results that were available during my care of the patient were reviewed by me and considered in my medical decision making (see chart for details).   Patient presents emergency department for evaluation of chest discomfort over the past 2 days.  Symptoms have been intermittent but now more constant.  EKG is reassuring.  Chest pain is somewhat atypical with both sharp and dull components.  Patient does have risk factors for ACS including diabetes and hypertension.  Discussed the patient's hypertension.  She has been taking losartan and plans to discuss further with her primary care doctor regarding if additional medications are needed. HEART score 2 as documented in the chart. Doubt PE clinically.   11:10 AM  Patient's repeat troponin is unchanged at 3.  She continues to have burning/atypical chest discomfort.  My suspicion for ACS/PE is very low.  Given her risk factors I will refer her to cardiology for further risk ratification.  Plan to prescribe Protonix, Maalox, MiraLAX to help with symptoms as a possible GI source.  Patient to call her PCP today to schedule the next available follow-up appointment.  Discussed strict ED return precautions especially with worsening pain, diaphoresis, shortness of breath, nausea, and other anginal equivalents.  ____________________________________________  FINAL CLINICAL IMPRESSION(S) / ED DIAGNOSES  Final diagnoses:  Precordial chest pain     MEDICATIONS GIVEN DURING THIS VISIT:  Medications  alum & mag hydroxide-simeth  (MAALOX/MYLANTA) 200-200-20 MG/5ML suspension 15 mL (15 mLs Oral Given 08/30/20 0748)  ketorolac (TORADOL) 30 MG/ML injection 30 mg (30 mg Intravenous Given 08/30/20 0959)     NEW OUTPATIENT MEDICATIONS STARTED DURING THIS VISIT:  New Prescriptions   ALUM & MAG HYDROXIDE-SIMETH (MAALOX MAX) 935-701-77 MG/5ML SUSPENSION    Take 10 mLs by mouth every 6 (six) hours as needed for indigestion.   PANTOPRAZOLE (PROTONIX) 40 MG TABLET    Take 1 tablet (40 mg total) by mouth daily.   POLYETHYLENE GLYCOL POWDER (MIRALAX) 17 GM/SCOOP POWDER    Take 255 g by mouth daily as needed for mild constipation or moderate constipation.    Note:  This document was prepared using Dragon voice recognition software and may include unintentional dictation errors.  Nanda Quinton, MD, [facility] Emergency Medicine    Quinetta Shilling, [facility], MD 08/30/20 [phone]

## 2020-09-03 ENCOUNTER — Telehealth: Payer: Self-pay | Admitting: Cardiology

## 2020-09-03 ENCOUNTER — Other Ambulatory Visit: Payer: Self-pay

## 2020-09-03 ENCOUNTER — Encounter: Payer: Self-pay | Admitting: *Deleted

## 2020-09-03 ENCOUNTER — Ambulatory Visit (INDEPENDENT_AMBULATORY_CARE_PROVIDER_SITE_OTHER): Payer: BC Managed Care – PPO | Admitting: Cardiology

## 2020-09-03 VITALS — BP 150/72 | HR 87 | Ht 66.0 in | Wt 206.0 lb

## 2020-09-03 DIAGNOSIS — R011 Cardiac murmur, unspecified: Secondary | ICD-10-CM

## 2020-09-03 DIAGNOSIS — E782 Mixed hyperlipidemia: Secondary | ICD-10-CM | POA: Diagnosis not present

## 2020-09-03 DIAGNOSIS — E088 Diabetes mellitus due to underlying condition with unspecified complications: Secondary | ICD-10-CM | POA: Insufficient documentation

## 2020-09-03 DIAGNOSIS — F17210 Nicotine dependence, cigarettes, uncomplicated: Secondary | ICD-10-CM

## 2020-09-03 DIAGNOSIS — R0789 Other chest pain: Secondary | ICD-10-CM

## 2020-09-03 DIAGNOSIS — R072 Precordial pain: Secondary | ICD-10-CM

## 2020-09-03 HISTORY — DX: Cardiac murmur, unspecified: R01.1

## 2020-09-03 HISTORY — DX: Nicotine dependence, cigarettes, uncomplicated: F17.210

## 2020-09-03 HISTORY — DX: Other chest pain: R07.89

## 2020-09-03 HISTORY — DX: Diabetes mellitus due to underlying condition with unspecified complications: E08.8

## 2020-09-03 MED ORDER — NITROGLYCERIN 0.4 MG SL SUBL: 0.4000 mg | SUBLINGUAL_TABLET | SUBLINGUAL | 6 refills | Status: AC | PRN

## 2020-09-03 NOTE — Patient Instructions (Signed)
Medication Instructions:  Your physician has recommended you make the following change in your medication:   Take Nitroglycerin as needed for chest pain.  *If you need a refill on your cardiac medications before your next appointment, please call your pharmacy*   Lab Work: None oredere If you have labs (blood work) drawn today and your tests are completely normal, you will receive your results only by: Marland Kitchen MyChart Message (if you have MyChart) OR . A paper copy in the mail If you have any lab test that is abnormal or we need to change your treatment, we will call you to review the results.   Testing/Procedures: Your physician has requested that you have an echocardiogram. Echocardiography is a painless test that uses sound waves to create images of your heart. It provides your doctor with information about the size and shape of your heart and how well your heart's chambers and valves are working. This procedure takes approximately one hour. There are no restrictions for this procedure.  Your physician has requested that you have a lexiscan myoview. For further information please visit [email or web address]. Please follow instruction sheet, as given.  The test will take approximately 3 to 4 hours to complete; you may bring reading material.  If someone comes with you to your appointment, they will need to remain in the main lobby due to limited space in the testing area. **If you are pregnant or breastfeeding, please notify the nuclear lab prior to your appointment**  How to prepare for your Myocardial Perfusion Test: . Do not eat or drink 3 hours prior to your test, except you may have water. . Do not consume products containing caffeine (regular or decaffeinated) 12 hours prior to your test. (ex: coffee, chocolate, sodas, tea). . Do bring a list of your current medications with you.  If not listed below, you may take your medications as normal. . Do wear comfortable clothes (no dresses or  overalls) and walking shoes, tennis shoes preferred (No heels or open toe shoes are allowed). . Do NOT wear cologne, perfume, aftershave, or lotions (deodorant is allowed). . If these instructions are not followed, your test will have to be rescheduled.    Follow-Up: At [facility], you and your health needs are our priority.  As part of our continuing mission to provide you with exceptional heart care, we have created designated Provider Care Teams.  These Care Teams include your primary Cardiologist (physician) and Advanced Practice Providers (APPs -  Physician Assistants and Nurse Practitioners) who all work together to provide you with the care you need, when you need it.  We recommend signing up for the patient portal called "MyChart".  Sign up information is provided on this After Visit Summary.  MyChart is used to connect with patients for Virtual Visits (Telemedicine).  Patients are able to view lab/test results, encounter notes, upcoming appointments, etc.  Non-urgent messages can be sent to your provider as well.   To learn more about what you can do with MyChart, go to [email or web address].    Your next appointment:   1 month(s)  The format for your next appointment:   In Person  Provider:   Jyl Heinz, MD   Other Instructions  Cardiac Nuclear Scan A cardiac nuclear scan is a test that is done to check the flow of blood to your heart. It is done when you are resting and when you are exercising. The test looks for problems such as:  Not enough blood reaching a portion  of the heart.  The heart muscle not working as it should. You may need this test if:  You have heart disease.  You have had lab results that are not normal.  You have had heart surgery or a balloon procedure to open up blocked arteries (angioplasty).  You have chest pain.  You have shortness of breath. In this test, a special dye (tracer) is put into your bloodstream. The tracer will travel  to your heart. A camera will then take pictures of your heart to see how the tracer moves through your heart. This test is usually done at a hospital and takes 2-4 hours. Tell a doctor about:  Any allergies you have.  All medicines you are taking, including vitamins, herbs, eye drops, creams, and over-the-counter medicines.  Any problems you or family members have had with anesthetic medicines.  Any blood disorders you have.  Any surgeries you have had.  Any medical conditions you have.  Whether you are pregnant or may be pregnant. What are the risks? Generally, this is a safe test. However, problems may occur, such as:  Serious chest pain and heart attack. This is only a risk if the stress portion of the test is done.  Rapid heartbeat.  A feeling of warmth in your chest. This feeling usually does not last long.  Allergic reaction to the tracer. What happens before the test?  Ask your doctor about changing or stopping your normal medicines. This is important.  Follow instructions from your doctor about what you cannot eat or drink.  Remove your jewelry on the day of the test. What happens during the test?  An IV tube will be inserted into one of your veins.  Your doctor will give you a small amount of tracer through the IV tube.  You will wait for 20-40 minutes while the tracer moves through your bloodstream.  Your heart will be monitored with an electrocardiogram (ECG).  You will lie down on an exam table.  Pictures of your heart will be taken for about 15-20 minutes.  You may also have a stress test. For this test, one of these things may be done: ? You will be asked to exercise on a treadmill or a stationary bike. ? You will be given medicines that will make your heart work harder. This is done if you are unable to exercise.  When blood flow to your heart has peaked, a tracer will again be given through the IV tube.  After 20-40 minutes, you will get back on  the exam table. More pictures will be taken of your heart.  Depending on the tracer that is used, more pictures may need to be taken 3-4 hours later.  Your IV tube will be removed when the test is over. The test may vary among doctors and hospitals. What happens after the test?  Ask your doctor: ? Whether you can return to your normal schedule, including diet, activities, and medicines. ? Whether you should drink more fluids. This will help to remove the tracer from your body. Drink enough fluid to keep your pee (urine) pale yellow.  Ask your doctor, or the department that is doing the test: ? When will my results be ready? ? How will I get my results? Summary  A cardiac nuclear scan is a test that is done to check the flow of blood to your heart.  Tell your doctor whether you are pregnant or may be pregnant.  Before the test, ask your  doctor about changing or stopping your normal medicines. This is important.  Ask your doctor whether you can return to your normal activities. You may be asked to drink more fluids. This information is not intended to replace advice given to you by your health care provider. Make sure you discuss any questions you have with your health care provider. Document Revised: 03/12/2019 Document Reviewed: 05/06/2018 Elsevier Patient Education  [place].  Echocardiogram An echocardiogram is a procedure that uses painless sound waves (ultrasound) to produce an image of the heart. Images from an echocardiogram can provide important information about:  Signs of coronary artery disease (CAD).  Aneurysm detection. An aneurysm is a weak or damaged part of an artery wall that bulges out from the normal force of blood pumping through the body.  Heart size and shape. Changes in the size or shape of the heart can be associated with certain conditions, including heart failure, aneurysm, and CAD.  Heart muscle function.  Heart valve function.  Signs of a  past heart attack.  Fluid buildup around the heart.  Thickening of the heart muscle.  A tumor or infectious growth around the heart valves. Tell a health care provider about:  Any allergies you have.  All medicines you are taking, including vitamins, herbs, eye drops, creams, and over-the-counter medicines.  Any blood disorders you have.  Any surgeries you have had.  Any medical conditions you have.  Whether you are pregnant or may be pregnant. What are the risks? Generally, this is a safe procedure. However, problems may occur, including:  Allergic reaction to dye (contrast) that may be used during the procedure. What happens before the procedure? No specific preparation is needed. You may eat and drink normally. What happens during the procedure?   An IV tube may be inserted into one of your veins.  You may receive contrast through this tube. A contrast is an injection that improves the quality of the pictures from your heart.  A gel will be applied to your chest.  A wand-like tool (transducer) will be moved over your chest. The gel will help to transmit the sound waves from the transducer.  The sound waves will harmlessly bounce off of your heart to allow the heart images to be captured in real-time motion. The images will be recorded on a computer. The procedure may vary among health care providers and hospitals. What happens after the procedure?  You may return to your normal, everyday life, including diet, activities, and medicines, unless your health care provider tells you not to do that. Summary  An echocardiogram is a procedure that uses painless sound waves (ultrasound) to produce an image of the heart.  Images from an echocardiogram can provide important information about the size and shape of your heart, heart muscle function, heart valve function, and fluid buildup around your heart.  You do not need to do anything to prepare before this procedure. You  may eat and drink normally.  After the echocardiogram is completed, you may return to your normal, everyday life, unless your health care provider tells you not to do that. This information is not intended to replace advice given to you by your health care provider. Make sure you discuss any questions you have with your health care provider. Document Revised: 03/13/2019 Document Reviewed: 12/23/2016 Elsevier Patient Education  [place].

## 2020-09-03 NOTE — Progress Notes (Signed)
Cardiology Office Note:    Date:  09/03/2020   ID:  Tamara Shannon, DOB [birth year], MRN [id number]  PCP:  Rubie Maid, MD  Cardiologist:  Jenean Lindau, MD   Referring MD: Margette Fast, MD    ASSESSMENT:    1. Mixed hyperlipidemia   2. Chest discomfort   3. Cigarette smoker   4. Diabetes mellitus due to underlying condition with unspecified complications ([place])   5. Cardiac murmur    PLAN:    In order of problems listed above:  1. Primary prevention stressed with the patient.  Importance of compliance with diet medication stressed and she vocalized understanding.  I reviewed emergency room records extensively. 2. Chest discomfort: Following recommendations were made to her.  Sublingual nitroglycerin prescription was sent, its protocol and 911 protocol explained and the patient vocalized understanding questions were answered to the patient's satisfaction.  In view of the symptoms we will do a Lexiscan sestamibi.  She tells me that there is no chance of her being pregnant because she has had bilateral tubal ligations. 3. Cardiac murmur echocardiogram will be done to assess murmur on auscultation. 4. Mixed dyslipidemia diabetes mellitus: Diet was emphasized.  These issues are followed by her primary care physician.  Patient is on statin therapy. 5. Cigarette smoker: I spent 5 minutes with the patient discussing solely about smoking. Smoking cessation was counseled. I suggested to the patient also different medications and pharmacological interventions. Patient is keen to try stopping on its own at this time. He will get back to me if he needs any further assistance in this matter. 6. Patient will be seen in follow-up appointment in 1 months or earlier if the patient has any concerns    Medication Adjustments/Labs and Tests Ordered: Current medicines are reviewed at length with the patient today.  Concerns regarding medicines are outlined above.  No orders of the defined  types were placed in this encounter.  No orders of the defined types were placed in this encounter.    History of Present Illness:    Tamara Shannon is a 44 y.o. female who is being seen today for the evaluation of chest discomfort at the request of Long, Wonda Olds, MD.  Patient is a pleasant 44 year old female.  She has past medical history of mixed dyslipidemia and diabetes mellitus.  She has history of hepatitis B.  She is a smoker but quit about 10 days ago.  She mentions to me that she went to the emergency room with chest discomfort.  This does not occur with exertion.  She walks on a regular basis without any symptoms.  No chest pain orthopnea or PND.  At the time of my evaluation, the patient is alert awake oriented and in no distress.  Chest pressure-like sensation.  Past Medical History:  Diagnosis Date  . 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 Charcot foot due to diabetes mellitus ([place]) 12/18/2017  . Chronic hepatitis B ([place]) 02/01/2016  . Chronic kidney disease (CKD) stage G1/A2, glomerular filtration rate (GFR) equal to or greater than 90 mL/min/1.73 square meter and albuminuria creatinine ratio between 30-299 mg/g 02/01/2016  . Contracture of right Achilles tendon 12/18/2017  . Diabetes mellitus   . Diabetic peripheral neuropathy ([place]) 02/01/2016  . Elevated blood-pressure reading without diagnosis of hypertension 08/24/2016  . Foot deformity, acquired, right 01/08/2019  . Hepatic hemangioma 09/13/2017  . Hepatitis B   . Hyperlipidemia 02/01/2016  . Insulin long-term use ([place]) 03/11/2018  . Intrinsic eczema 11/30/2016  .  Keratoconjunctivitis sicca of both eyes not specified as Sjogren's 03/11/2018  . Kidney stone   . Migraine headache 02/01/2016  . Mild nonproliferative diabetic retinopathy of both eyes ([place]) 02/01/2016  . Mild nonproliferative diabetic retinopathy of both eyes without macular edema associated with type 2 diabetes mellitus ([place]) 03/11/2018  . Mild persistent asthma  without complication 03/02/761  . Obesity (BMI 30.0-34.9) 02/01/2016  . Presbyopia of both eyes 03/11/2018  . Type II or unspecified type diabetes mellitus with neurological manifestations, uncontrolled(250.62) 08/14/2013  . Vitreous floater, bilateral 03/11/2018    Past Surgical History:  Procedure Laterality Date  . ANKLE SURGERY    . KNEE SURGERY Right   . TUBAL LIGATION      Current Medications: Current Meds  Medication Sig  . alum & mag hydroxide-simeth (MAALOX MAX) 400-400-40 MG/5ML suspension Take 10 mLs by mouth every 6 (six) hours as needed for indigestion.  Marland Kitchen aspirin 81 MG chewable tablet Chew by mouth daily.  Marland Kitchen atorvastatin (LIPITOR) 10 MG tablet Take 10 mg by mouth once a week.  . doxycycline (VIBRAMYCIN) 100 MG capsule Take 1 capsule (100 mg total) by mouth 2 (two) times daily.  . fluticasone (FLONASE) 50 MCG/ACT nasal spray Place into the nose as needed.  . gabapentin (NEURONTIN) 300 MG capsule Take 600 mg by mouth 3 (three) times daily.  Marland Kitchen HYDROcodone-acetaminophen (NORCO/VICODIN) 5-325 MG tablet Take 1-2 tablets by mouth every 6 (six) hours as needed.  Marland Kitchen ibuprofen (ADVIL) 800 MG tablet Take 800 mg by mouth daily.  . insulin detemir (LEVEMIR) 100 UNIT/ML injection Inject 0.3 mLs (30 Units total) into the skin at bedtime.  . insulin glargine, 1 Unit Dial, (TOUJEO SOLOSTAR) 300 UNIT/ML Solostar Pen INJECT 40 UNITS SUBCUTANEOUSLY AT BEDTIME  . JANUMET XR [phone] MG TB24 Take 1 tablet by mouth daily.  Marland Kitchen losartan (COZAAR) 25 MG tablet Take 25 mg by mouth daily.  . naproxen (NAPROSYN) 500 MG tablet Take 1 tablet (500 mg total) by mouth 2 (two) times daily as needed for mild pain, moderate pain or headache (TAKE WITH MEALS.).  Marland Kitchen pantoprazole (PROTONIX) 40 MG tablet Take 1 tablet (40 mg total) by mouth daily.  . polyethylene glycol powder (MIRALAX) 17 GM/SCOOP powder Take 255 g by mouth daily as needed for mild constipation or moderate constipation.  . SUMAtriptan (IMITREX) 50 MG  tablet Take 1 tablet now then another tablet in 2 hours if no better.  . traMADol (ULTRAM) 50 MG tablet Take 1 tablet (50 mg total) by mouth every 6 (six) hours as needed.  Marland Kitchen UNKNOWN TO PATIENT      Allergies:   Lisinopril, Hydrocodone, and Latex   Social History   Socioeconomic History  . Marital status: Single    Spouse name: Not on file  . Number of children: Not on file  . Years of education: Not on file  . Highest education level: Not on file  Occupational History  . Not on file  Tobacco Use  . Smoking status: Former Research scientist (life sciences)  . Smokeless tobacco: Never Used  Substance and Sexual Activity  . Alcohol use: No  . Drug use: No  . Sexual activity: Yes    Partners: Male    Birth control/protection: Surgical  Other Topics Concern  . Not on file  Social History Narrative   Regular exercise: walk   Caffeine use: none   Social Determinants of Health   Financial Resource Strain:   . Difficulty of Paying Living Expenses: Not on file  Food  Insecurity:   . Worried About Charity fundraiser in the Last Year: Not on file  . Ran Out of Food in the Last Year: Not on file  Transportation Needs:   . Lack of Transportation (Medical): Not on file  . Lack of Transportation (Non-Medical): Not on file  Physical Activity:   . Days of Exercise per Week: Not on file  . Minutes of Exercise per Session: Not on file  Stress:   . Feeling of Stress : Not on file  Social Connections:   . Frequency of Communication with Friends and Family: Not on file  . Frequency of Social Gatherings with Friends and Family: Not on file  . Attends Religious Services: Not on file  . Active Member of Clubs or Organizations: Not on file  . Attends Archivist Meetings: Not on file  . Marital Status: Not on file     Family History: The patient's family history includes Diabetes in her maternal grandmother, mother, and paternal grandfather; Kidney disease in her maternal grandmother and mother; Stroke  in her mother.  ROS:   Please see the history of present illness.    All other systems reviewed and are negative.  EKGs/Labs/Other Studies Reviewed:    The following studies were reviewed today: EKG reveals sinus rhythm and nonspecific ST-T changes   Recent Labs: 08/30/2020: BUN 17; Creatinine, Ser 0.68; Hemoglobin 13.9; Platelets 229; Potassium 3.7; Sodium 137  Recent Lipid Panel No results found for: CHOL, TRIG, HDL, CHOLHDL, VLDL, LDLCALC, LDLDIRECT  Physical Exam:    VS:  BP (!) 150/72 (BP Location: Left Arm, Patient Position: Sitting, Cuff Size: Normal)   Pulse 87   Ht 5\' 6"  (1.676 m)   Wt 206 lb (93.4 kg)   LMP 08/22/2020   SpO2 98%   BMI 33.25 kg/m     Wt Readings from Last 3 Encounters:  09/03/20 206 lb (93.4 kg)  08/30/20 201 lb (91.2 kg)  05/09/17 208 lb (94.3 kg)     GEN: Patient is in no acute distress HEENT: Normal NECK: No JVD; No carotid bruits LYMPHATICS: No lymphadenopathy CARDIAC: S1 S2 regular, 2/6 systolic murmur at the apex. RESPIRATORY:  Clear to auscultation without rales, wheezing or rhonchi  ABDOMEN: Soft, non-tender, non-distended MUSCULOSKELETAL:  No edema; No deformity  SKIN: Warm and dry NEUROLOGIC:  Alert and oriented x 3 PSYCHIATRIC:  Normal affect    Signed, Jenean Lindau, MD  09/03/2020 11:27 AM    [place]

## 2020-09-03 NOTE — Telephone Encounter (Signed)
Encounter not needed

## 2020-09-22 ENCOUNTER — Telehealth (HOSPITAL_COMMUNITY): Payer: Self-pay | Admitting: *Deleted

## 2020-09-22 NOTE — Telephone Encounter (Signed)
Patient given detailed instructions per Myocardial Perfusion Study Information Sheet for the test on 09/27/20 at 8:15. Patient notified to arrive 15 minutes early and that it is imperative to arrive on time for appointment to keep from having the test rescheduled.  If you need to cancel or reschedule your appointment, please call the office within 24 hours of your appointment. . Patient verbalized understanding.Tamara Shannon

## 2020-09-24 NOTE — Telephone Encounter (Signed)
err

## 2020-09-27 ENCOUNTER — Ambulatory Visit (HOSPITAL_BASED_OUTPATIENT_CLINIC_OR_DEPARTMENT_OTHER): Payer: BC Managed Care – PPO

## 2020-09-27 ENCOUNTER — Other Ambulatory Visit: Payer: Self-pay

## 2020-09-27 ENCOUNTER — Ambulatory Visit (HOSPITAL_COMMUNITY): Payer: BC Managed Care – PPO | Attending: Cardiovascular Disease

## 2020-09-27 DIAGNOSIS — R011 Cardiac murmur, unspecified: Secondary | ICD-10-CM | POA: Diagnosis not present

## 2020-09-27 DIAGNOSIS — R072 Precordial pain: Secondary | ICD-10-CM | POA: Insufficient documentation

## 2020-09-27 DIAGNOSIS — E088 Diabetes mellitus due to underlying condition with unspecified complications: Secondary | ICD-10-CM | POA: Insufficient documentation

## 2020-09-27 DIAGNOSIS — R0789 Other chest pain: Secondary | ICD-10-CM | POA: Insufficient documentation

## 2020-09-27 LAB — ECHOCARDIOGRAM COMPLETE
Area-P 1/2: 4.06 cm2
Height: 66 in
S' Lateral: 2.6 cm
Weight: 3296 oz

## 2020-09-27 LAB — MYOCARDIAL PERFUSION IMAGING
LV dias vol: 97 mL (ref 46–106)
LV sys vol: 33 mL
Peak HR: 110 {beats}/min
Rest HR: 78 {beats}/min
SDS: 1
SRS: 0
SSS: 1
TID: 0.95

## 2020-09-27 MED ORDER — TECHNETIUM TC 99M TETROFOSMIN IV KIT
32.4000 | PACK | Freq: Once | INTRAVENOUS | Status: AC | PRN
  Administered 2020-09-27: 32.4 via INTRAVENOUS
  Filled 2020-09-27: qty 33

## 2020-09-27 MED ORDER — TECHNETIUM TC 99M TETROFOSMIN IV KIT
10.9000 | PACK | Freq: Once | INTRAVENOUS | Status: AC | PRN
  Administered 2020-09-27: 10.9 via INTRAVENOUS
  Filled 2020-09-27: qty 11

## 2020-09-27 MED ORDER — REGADENOSON 0.4 MG/5ML IV SOLN
0.4000 mg | Freq: Once | INTRAVENOUS | Status: AC
  Administered 2020-09-27: 0.4 mg via INTRAVENOUS

## 2020-09-28 ENCOUNTER — Telehealth: Payer: Self-pay

## 2020-09-28 NOTE — Telephone Encounter (Signed)
-----   Message from Jenean Lindau, MD sent at 09/28/2020  8:43 AM EDT ----- The results of the study is unremarkable. Please inform patient. I will discuss in detail at next appointment. Cc  primary care/referring physician Jenean Lindau, MD 09/28/2020 8:43 AM

## 2020-09-28 NOTE — Telephone Encounter (Signed)
Left message on patients voicemail to please return our call.   

## 2020-09-28 NOTE — Telephone Encounter (Signed)
Pt is returning call.  

## 2020-09-28 NOTE — Telephone Encounter (Signed)
Spoke with patient regarding results and recommendation.  Patient verbalizes understanding and is agreeable to plan of care. Advised patient to call back with any issues or concerns.  

## 2020-10-06 ENCOUNTER — Ambulatory Visit: Payer: BC Managed Care – PPO | Admitting: Cardiology

## 2020-10-11 DIAGNOSIS — N200 Calculus of kidney: Secondary | ICD-10-CM | POA: Insufficient documentation

## 2020-10-11 DIAGNOSIS — B1910 Unspecified viral hepatitis B without hepatic coma: Secondary | ICD-10-CM | POA: Insufficient documentation

## 2020-10-12 ENCOUNTER — Ambulatory Visit: Payer: BC Managed Care – PPO | Admitting: Cardiology

## 2021-12-16 IMAGING — CR DG CHEST 2V
2 series · 2 of 2 positions shown · non-contrast
Comparison: Chest x-ray report 11/07/2013.

CLINICAL DATA: Chest pain.

EXAM:
CHEST - 2 VIEW

[w chest pa]
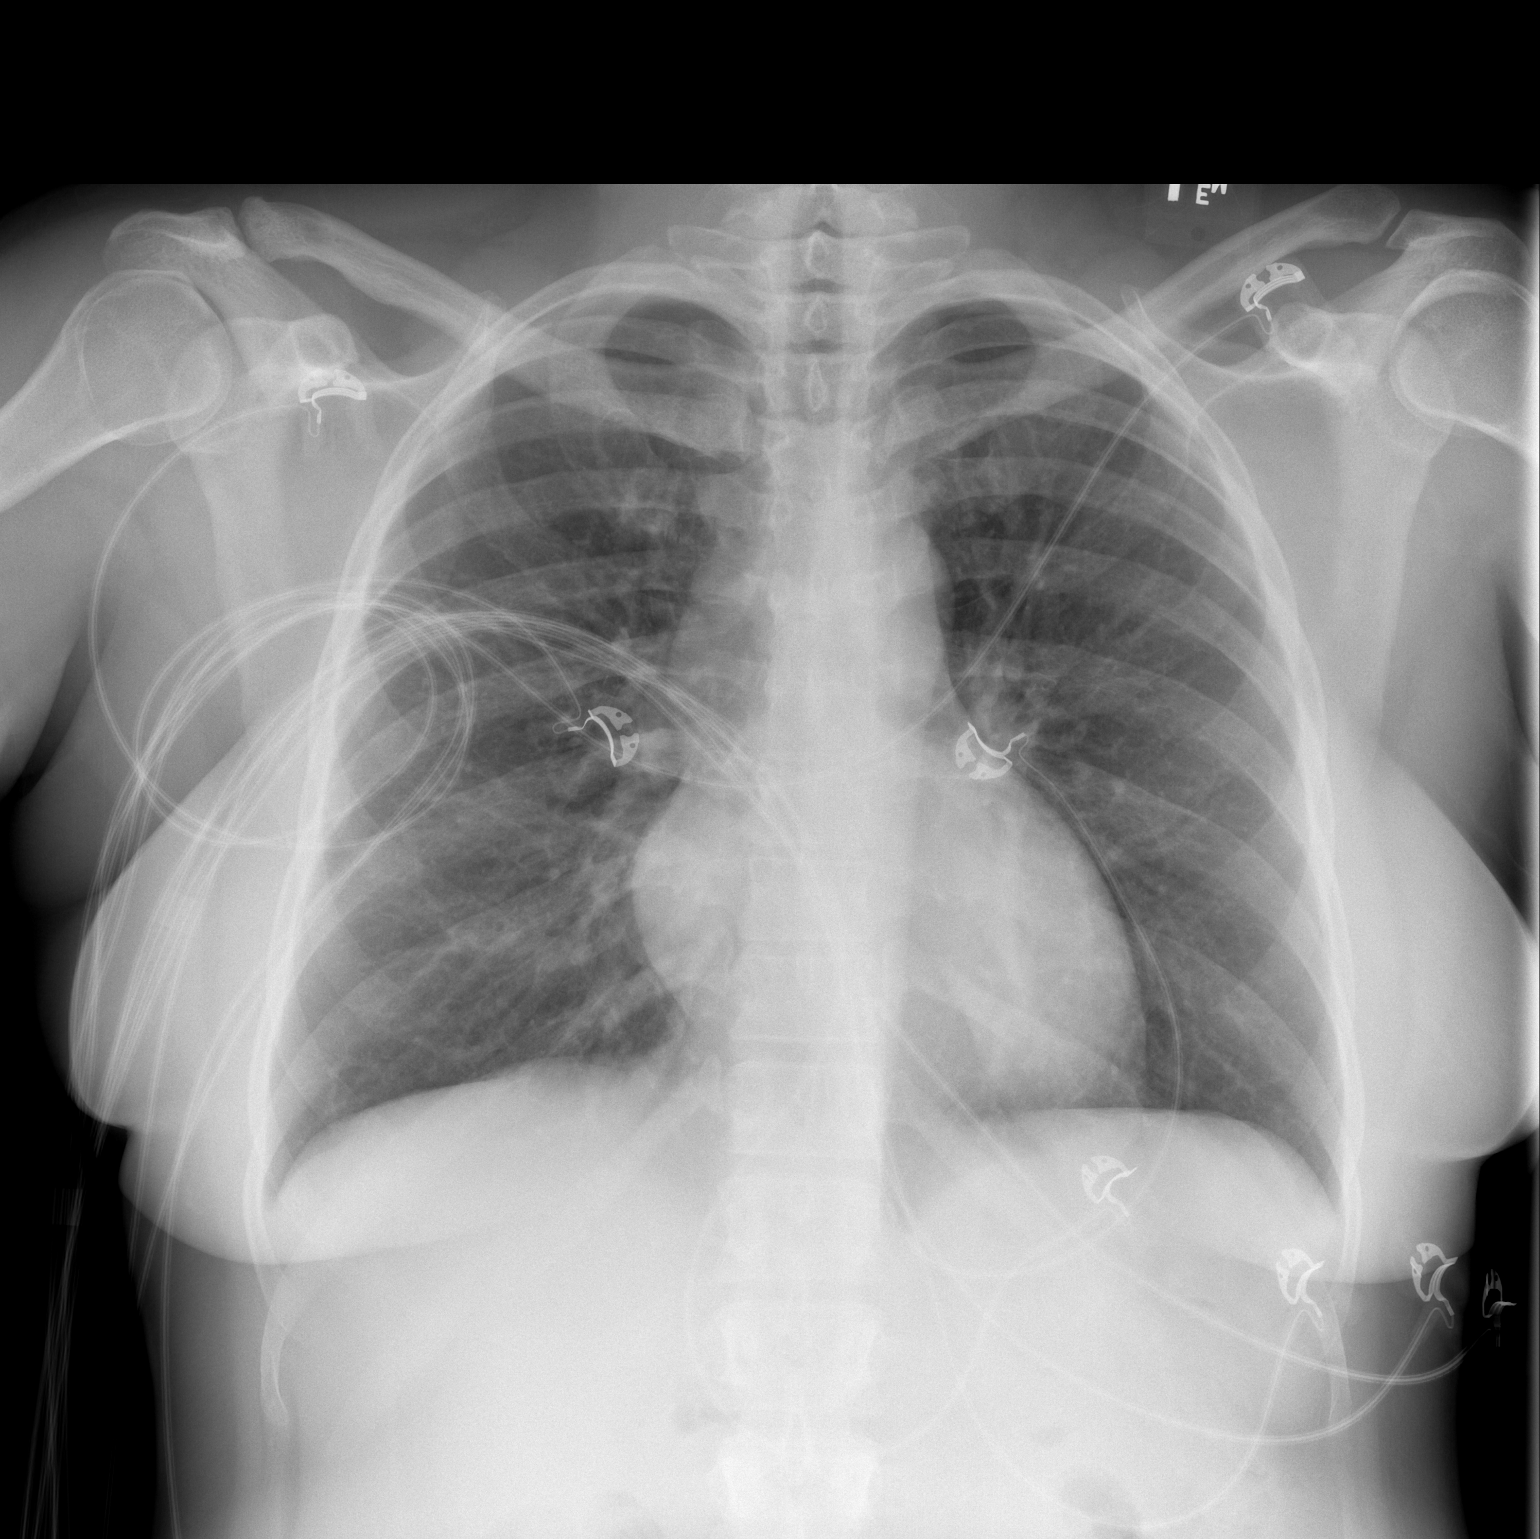

[w chest lat]
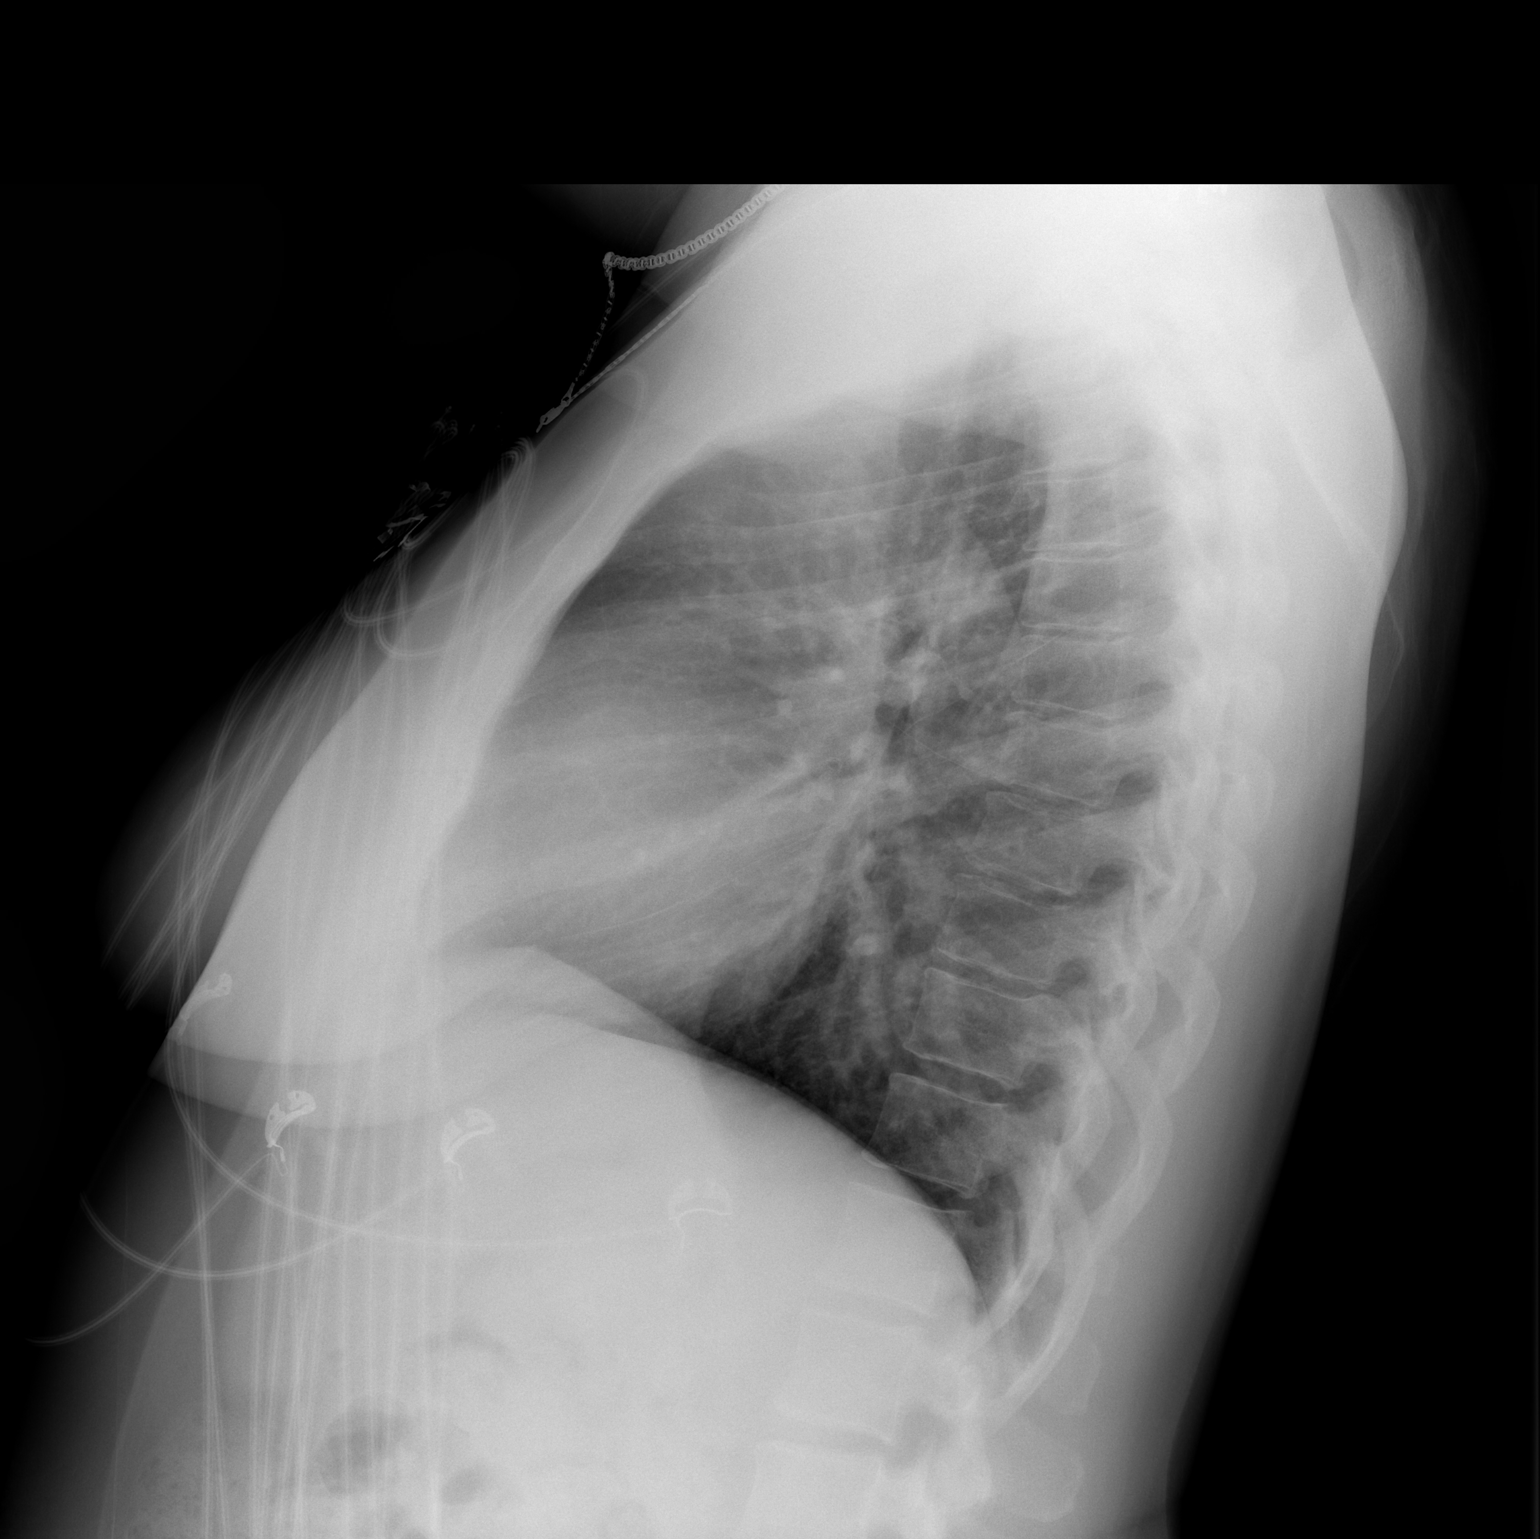

[2 of 2 positions shown; findings below may reference images not displayed]

FINDINGS: Mediastinum hilar structures normal. Heart size normal. No focal
infiltrate. No pleural effusion or pneumothorax. No acute bony
abnormality.
IMPRESSION: No acute cardiopulmonary disease.

## 2023-04-10 ENCOUNTER — Other Ambulatory Visit: Payer: Self-pay

## 2023-04-10 ENCOUNTER — Emergency Department (HOSPITAL_BASED_OUTPATIENT_CLINIC_OR_DEPARTMENT_OTHER)
Admission: EM | Admit: 2023-04-10 | Discharge: 2023-04-10 | Disposition: A | Discharge status: Home / Self Care | Payer: BC Managed Care – PPO | Attending: Emergency Medicine | Admitting: Emergency Medicine

## 2023-04-10 ENCOUNTER — Encounter (HOSPITAL_BASED_OUTPATIENT_CLINIC_OR_DEPARTMENT_OTHER): Payer: Self-pay | Admitting: Urology

## 2023-04-10 ENCOUNTER — Other Ambulatory Visit (HOSPITAL_BASED_OUTPATIENT_CLINIC_OR_DEPARTMENT_OTHER): Payer: Self-pay

## 2023-04-10 DIAGNOSIS — Z91040 Latex allergy status: Secondary | ICD-10-CM | POA: Insufficient documentation

## 2023-04-10 DIAGNOSIS — R1110 Vomiting, unspecified: Secondary | ICD-10-CM | POA: Insufficient documentation

## 2023-04-10 DIAGNOSIS — Z7982 Long term (current) use of aspirin: Secondary | ICD-10-CM | POA: Insufficient documentation

## 2023-04-10 DIAGNOSIS — R197 Diarrhea, unspecified: Secondary | ICD-10-CM | POA: Diagnosis present

## 2023-04-10 DIAGNOSIS — R109 Unspecified abdominal pain: Secondary | ICD-10-CM | POA: Diagnosis not present

## 2023-04-10 DIAGNOSIS — Z794 Long term (current) use of insulin: Secondary | ICD-10-CM | POA: Insufficient documentation

## 2023-04-10 LAB — URINALYSIS, MICROSCOPIC (REFLEX)

## 2023-04-10 LAB — COMPREHENSIVE METABOLIC PANEL
ALT: 32 U/L (ref 0–44)
AST: 47 U/L — ABNORMAL HIGH (ref 15–41)
Albumin: 3.3 g/dL — ABNORMAL LOW (ref 3.5–5.0)
Alkaline Phosphatase: 46 U/L (ref 38–126)
Anion gap: 9 (ref 5–15)
BUN: 10 mg/dL (ref 6–20)
CO2: 22 mmol/L (ref 22–32)
Calcium: 8.4 mg/dL — ABNORMAL LOW (ref 8.9–10.3)
Chloride: 106 mmol/L (ref 98–111)
Creatinine, Ser: 0.8 mg/dL (ref 0.44–1.00)
GFR, Estimated: >60 mL/min (ref >60)
Glucose, Bld: 89 mg/dL (ref 70–99)
Potassium: 3.5 mmol/L (ref 3.5–5.1)
Sodium: 137 mmol/L (ref 135–145)
Total Bilirubin: 1.3 mg/dL — ABNORMAL HIGH (ref 0.3–1.2)
Total Protein: 6.8 g/dL (ref 6.5–8.1)

## 2023-04-10 LAB — URINALYSIS, ROUTINE W REFLEX MICROSCOPIC
Glucose, UA: NEGATIVE mg/dL
Ketones, ur: NEGATIVE mg/dL
Leukocytes,Ua: NEGATIVE
Nitrite: POSITIVE — AB
Protein, ur: >=300 mg/dL — AB
Specific Gravity, Urine: >=1.03 (ref 1.005–1.030)
pH: 5.5 (ref 5.0–8.0)

## 2023-04-10 LAB — CBC
HCT: 36.5 % (ref 36.0–46.0)
Hemoglobin: 12.6 g/dL (ref 12.0–15.0)
MCH: 29.4 pg (ref 26.0–34.0)
MCHC: 34.5 g/dL (ref 30.0–36.0)
MCV: 85.3 fL (ref 80.0–100.0)
Platelets: 307 10*3/uL (ref 150–400)
RBC: 4.28 MIL/uL (ref 3.87–5.11)
RDW: 12.5 % (ref 11.5–15.5)
WBC: 6.2 10*3/uL (ref 4.0–10.5)
nRBC: 0 % (ref 0.0–0.2)

## 2023-04-10 LAB — LIPASE, BLOOD: Lipase: 31 U/L (ref 11–51)

## 2023-04-10 LAB — PREGNANCY, URINE: Preg Test, Ur: NEGATIVE

## 2023-04-10 MED ORDER — DIPHENOXYLATE-ATROPINE 2.5-0.025 MG PO TABS
1.0000 | ORAL_TABLET | Freq: Once | ORAL | Status: AC
  Administered 2023-04-10: 1 via ORAL
  Filled 2023-04-10: qty 1

## 2023-04-10 MED ORDER — SODIUM CHLORIDE 0.9 % IV BOLUS
1000.0000 mL | Freq: Once | INTRAVENOUS | Status: AC
  Administered 2023-04-10: 1000 mL via INTRAVENOUS

## 2023-04-10 MED ORDER — DIPHENOXYLATE-ATROPINE 2.5-0.025 MG PO TABS
1.0000 | ORAL_TABLET | Freq: Four times a day (QID) | ORAL | 0 refills | Status: AC | PRN
  Filled 2023-04-10: qty 20, 5d supply, fill #0

## 2023-04-10 NOTE — Discharge Instructions (Addendum)
Please read and follow all provided instructions.  Your diagnoses today include:  1. Diarrhea, unspecified type     Tests performed today include: Blood cell counts and platelets: Were normal Kidney and liver function tests: No severe problems Pancreas function test (called lipase)  Urine test to look for infection GI pathogen panel: results will return in 24-48 hours.  Vital signs. See below for your results today.   Medications prescribed:  Lomotil: medication for diarrhea  Take any prescribed medications only as directed.  Home care instructions:  Follow any educational materials contained in this packet.  Follow-up instructions: Please follow-up with your primary care provider in the next 3 days for further evaluation of your symptoms.    Return instructions:  SEEK IMMEDIATE MEDICAL ATTENTION IF: The pain does not go away or becomes severe  A temperature above 101F develops  Repeated vomiting occurs (multiple episodes)  The pain becomes localized to portions of the abdomen. The right side could possibly be appendicitis. In an adult, the left lower portion of the abdomen could be colitis or diverticulitis.  Blood is being passed in stools or vomit (bright red or black tarry stools)  You develop chest pain, difficulty breathing, dizziness or fainting, or become confused, poorly responsive, or inconsolable (young children) If you have any other emergent concerns regarding your health  Additional Information: Abdominal (belly) pain can be caused by many things. Your caregiver performed an examination and possibly ordered blood/urine tests and imaging (CT scan, x-rays, ultrasound). Many cases can be observed and treated at home after initial evaluation in the emergency department. Even though you are being discharged home, abdominal pain can be unpredictable. Therefore, you need a repeated exam if your pain does not resolve, returns, or worsens. Most patients with abdominal pain  don't have to be admitted to the hospital or have surgery, but serious problems like appendicitis and gallbladder attacks can start out as nonspecific pain. Many abdominal conditions cannot be diagnosed in one visit, so follow-up evaluations are very important.  Your vital signs today were: BP 133/71   Pulse 92   Temp 98.3 F (36.8 C) (Oral)   Resp 17   Ht 5\' 6"  (1.676 m)   Wt 93.4 kg   LMP 03/13/2023 (Approximate)   SpO2 100%   BMI 33.23 kg/m  If your blood pressure (bp) was elevated above 135/85 this visit, please have this repeated by your doctor within one month. --------------

## 2023-04-10 NOTE — ED Provider Notes (Signed)
[place] [facility] Provider Note   CSN: [id number] Arrival date & time: 04/10/23  1148     History  Chief Complaint  Patient presents with   Diarrhea    Tamara Shannon is a 47 y.o. female.  Patient presents to the emergency department today for evaluation of ongoing diarrhea.  Patient had symptoms that started on around 04/02/2023 while on a cruise.  She denies that other family members had similar symptoms.  She initially had vomiting with her diarrhea, however the vomiting has improved.  Over the past week she has had very frequent episodes of watery, poorly formed stool that are not bloody or black.  She has had some mild nonfocal abdominal cramping at times but not persistent.  No fevers.  She was seen by PCP just after returning from the cruise 4 days ago and was prescribed Imodium.  She does not feel like this has helped much.  She denies history of abdominal surgeries or other GI problems.  She denies decreased urination, dysuria, hematuria, increased frequency or urgency.      Home Medications Prior to Admission medications   Medication Sig Start Date End Date Taking? Authorizing Provider  alum & mag hydroxide-simeth (MAALOX MAX) 400-400-40 MG/5ML suspension Take 10 mLs by mouth every 6 (six) hours as needed for indigestion. 08/30/20   Long, Arlyss Repress, MD  aspirin 81 MG chewable tablet Chew by mouth daily.    [provider]  atorvastatin (LIPITOR) 10 MG tablet Take 10 mg by mouth once a week. 08/18/20   [provider]  doxycycline (VIBRAMYCIN) 100 MG capsule Take 1 capsule (100 mg total) by mouth 2 (two) times daily. 09/09/16   Gwyneth Sprout, MD  fluticasone (FLONASE) 50 MCG/ACT nasal spray Place into the nose as needed. 11/13/19   [provider]  gabapentin (NEURONTIN) 300 MG capsule Take 600 mg by mouth 3 (three) times daily. 08/09/20   [provider]  HYDROcodone-acetaminophen (NORCO/VICODIN) 5-325  MG tablet Take 1-2 tablets by mouth every 6 (six) hours as needed. 09/09/16   Gwyneth Sprout, MD  ibuprofen (ADVIL) 800 MG tablet Take 800 mg by mouth daily. 06/01/20   [provider]  insulin detemir (LEVEMIR) 100 UNIT/ML injection Inject 0.3 mLs (30 Units total) into the skin at bedtime. 08/14/13   Carlus Pavlov, MD  insulin glargine, 1 Unit Dial, (TOUJEO SOLOSTAR) 300 UNIT/ML Solostar Pen INJECT 40 UNITS SUBCUTANEOUSLY AT BEDTIME 06/11/20   [provider]  JANUMET XR [phone] MG TB24 Take 1 tablet by mouth daily. 06/15/20   [provider]  losartan (COZAAR) 25 MG tablet Take 25 mg by mouth daily. 06/12/20   [provider]  naproxen (NAPROSYN) 500 MG tablet Take 1 tablet (500 mg total) by mouth 2 (two) times daily as needed for mild pain, moderate pain or headache (TAKE WITH MEALS.). 01/05/16   Street, [place], PA-C  nitroGLYCERIN (NITROSTAT) 0.4 MG SL tablet Place 1 tablet (0.4 mg total) under the tongue every 5 (five) minutes as needed. 09/03/20 12/02/20  Revankar, Aundra Dubin, MD  pantoprazole (PROTONIX) 40 MG tablet Take 1 tablet (40 mg total) by mouth daily. 08/30/20 09/29/20  Long, Arlyss Repress, MD  polyethylene glycol powder (MIRALAX) 17 GM/SCOOP powder Take 255 g by mouth daily as needed for mild constipation or moderate constipation. 08/30/20   Long, Arlyss Repress, MD  SUMAtriptan (IMITREX) 50 MG tablet Take 1 tablet now then another tablet in 2 hours if no better. 12/16/19  [provider]  traMADol (ULTRAM) 50 MG tablet Take 1 tablet (50 mg total) by mouth every 6 (six) hours as needed. 08/24/15   Benjiman Core, MD  UNKNOWN TO PATIENT     [provider]      Allergies    Lisinopril, Hydrocodone, and Latex    Review of Systems   Review of Systems  Physical Exam Updated Vital Signs BP 131/80   Pulse 94   Temp 98.3 F (36.8 C) (Oral)   Resp 17   Ht 5\' 6"  (1.676 m)   Wt 93.4 kg   LMP 03/13/2023 (Approximate)   SpO2 100%   BMI 33.23  kg/m   Physical Exam Vitals and nursing note reviewed.  Constitutional:      General: She is not in acute distress.    Appearance: She is well-developed.  HENT:     Head: Normocephalic and atraumatic.     Right Ear: External ear normal.     Left Ear: External ear normal.     Nose: Nose normal.  Eyes:     Conjunctiva/sclera: Conjunctivae normal.  Cardiovascular:     Rate and Rhythm: Normal rate and regular rhythm.     Heart sounds: No murmur heard. Pulmonary:     Effort: No respiratory distress.     Breath sounds: No wheezing, rhonchi or rales.  Abdominal:     Palpations: Abdomen is soft.     Tenderness: There is no abdominal tenderness. There is no guarding or rebound.  Musculoskeletal:     Cervical back: Normal range of motion and neck supple.     Right lower leg: No edema.     Left lower leg: No edema.  Skin:    General: Skin is warm and dry.     Findings: No rash.  Neurological:     General: No focal deficit present.     Mental Status: She is alert. Mental status is at baseline.     Motor: No weakness.  Psychiatric:        Mood and Affect: Mood normal.     ED Results / Procedures / Treatments   Labs (all labs ordered are listed, but only abnormal results are displayed) Labs Reviewed  COMPREHENSIVE METABOLIC PANEL - Abnormal; Notable for the following components:      Result Value   Calcium 8.4 (*)    Albumin 3.3 (*)    AST 47 (*)    Total Bilirubin 1.3 (*)    All other components within normal limits  URINALYSIS, ROUTINE W REFLEX MICROSCOPIC - Abnormal; Notable for the following components:   Hgb urine dipstick TRACE (*)    Bilirubin Urine SMALL (*)    Protein, ur >=300 (*)    Nitrite POSITIVE (*)    All other components within normal limits  URINALYSIS, MICROSCOPIC (REFLEX) - Abnormal; Notable for the following components:   Bacteria, UA MANY (*)    All other components within normal limits  GASTROINTESTINAL PANEL BY PCR, STOOL (REPLACES STOOL CULTURE)   LIPASE, BLOOD  CBC  PREGNANCY, URINE    EKG None  Radiology No results found.  Procedures Procedures    Medications Ordered in ED Medications  sodium chloride 0.9 % bolus 1,000 mL (1,000 mLs Intravenous New Bag/Given 04/10/23 1409)    ED Course/ Medical Decision Making/ A&P    Patient seen and examined. History obtained directly from patient. Work-up including labs, imaging, EKG ordered in triage, if performed, were reviewed.    Labs/EKG: Independently reviewed  and interpreted.  This included: CBC unremarkable; CMP with normal electrolytes and kidney function; lipase normal; pregnancy negative; UA with bacteria and nitrite, however patient denies any irritative UTI symptoms at this time.  If patient is able to give a stool sample, given duration of symptoms, will send GI pathogen panel.  Patient is aware that this would not return today.  Imaging: None ordered  Medications/Fluids: IV fluid bolus  Most recent vital signs reviewed and are as follows: BP 131/80   Pulse 94   Temp 98.3 F (36.8 C) (Oral)   Resp 17   Ht 5\' 6"  (1.676 m)   Wt 93.4 kg   LMP 03/13/2023 (Approximate)   SpO2 100%   BMI 33.23 kg/m   Initial impression: Diarrhea illness, reassuring labs  4:00 PM Reassessment performed. Patient continues to have stools.  Dose of Lomotil ordered.  She was able to give stool sample and GI pathogen panel underway.  Reviewed pertinent lab work and imaging with patient at bedside. Questions answered.   Most current vital signs reviewed and are as follows: BP 131/77   Pulse 95   Temp 98.4 F (36.9 C) (Oral)   Resp 18   Ht 5\' 6"  (1.676 m)   Wt 93.4 kg   LMP 03/13/2023 (Approximate)   SpO2 100%   BMI 33.23 kg/m   Plan: Discharge to home.   Prescriptions written for: Lomotil  Other home care instructions discussed: Maintain good hydration and oral intake to support electrolytes  ED return instructions discussed: The patient was urged to return to the  Emergency Department immediately with worsening of current symptoms, worsening abdominal pain, persistent vomiting, blood noted in stools, fever, or any other concerns. The patient verbalized understanding.   Follow-up instructions discussed: Patient encouraged to follow-up with their PCP in 3 days.                               Medical Decision Making Amount and/or Complexity of Data Reviewed Labs: ordered.  Risk Prescription drug management.   For this patient's complaint of abdominal pain and diarrhea, the following conditions were considered on the differential diagnosis: gastritis/PUD, enteritis/duodenitis, appendicitis, cholelithiasis/cholecystitis, cholangitis, pancreatitis, ruptured viscus, colitis, diverticulitis, small/large bowel obstruction, proctitis, cystitis, pyelonephritis, ureteral colic, aortic dissection, aortic aneurysm. In women, ectopic pregnancy, pelvic inflammatory disease, ovarian cysts, and tubo-ovarian abscess were also considered. Atypical chest etiologies were also considered including ACS, PE, and pneumonia.  She is understandably frustrated over having ongoing diarrhea over the past 8 to 9 days.  Fortunately, she continues to be well-hydrated.  Her kidney function electrolytes are normal.  No focal abdominal pain and I do not feel that advanced imaging is indicated at this time.  Hopefully, the GI pathogen panel will give [place] some insight into why this is happening to her.  She has had limited benefit from Imodium and will be tried on a course of Lomotil.  This was given in the emergency room and she was given a prescription.  The patient's vital signs, pertinent lab work and imaging were reviewed and interpreted as discussed in the ED course. Hospitalization was considered for further testing, treatments, or serial exams/observation. However as patient is well-appearing, has a stable exam, and reassuring studies today, I do not feel that they warrant admission at  this time. This plan was discussed with the patient who verbalizes agreement and comfort with this plan and seems reliable and able to return to  the Emergency Department with worsening or changing symptoms.           Final Clinical Impression(s) / ED Diagnoses Final diagnoses:  Diarrhea, unspecified type    Rx / DC Orders ED Discharge Orders          Ordered    diphenoxylate-atropine (LOMOTIL) 2.5-0.025 MG tablet  4 times daily PRN        04/10/23 1559              Renne Crigler, PA-C 04/10/23 1603    Gwyneth Sprout, MD 04/14/23 [phone]

## 2023-04-10 NOTE — ED Notes (Signed)
Informed pt of need for UA sample, unable to void at this time, will retry after bolus

## 2023-04-10 NOTE — ED Triage Notes (Signed)
Pt state diarrhea x 1 week after eating  Cramping abdominal pain  Denies any N/V  Denies fever  Taking immodium with no relief

## 2023-04-10 NOTE — ED Notes (Signed)
Pt reports feeling "gassy" after eating and drinking

## 2023-04-11 LAB — GASTROINTESTINAL PANEL BY PCR, STOOL (REPLACES STOOL CULTURE)

## 2024-01-27 ENCOUNTER — Encounter (HOSPITAL_BASED_OUTPATIENT_CLINIC_OR_DEPARTMENT_OTHER): Payer: Self-pay | Admitting: Emergency Medicine

## 2024-01-27 ENCOUNTER — Other Ambulatory Visit: Payer: Self-pay

## 2024-01-27 ENCOUNTER — Emergency Department (HOSPITAL_BASED_OUTPATIENT_CLINIC_OR_DEPARTMENT_OTHER)
Admission: EM | Admit: 2024-01-27 | Discharge: 2024-01-27 | Disposition: A | Discharge status: Home / Self Care | Payer: BC Managed Care – PPO | Attending: Emergency Medicine | Admitting: Emergency Medicine

## 2024-01-27 DIAGNOSIS — J45909 Unspecified asthma, uncomplicated: Secondary | ICD-10-CM | POA: Diagnosis not present

## 2024-01-27 DIAGNOSIS — R519 Headache, unspecified: Secondary | ICD-10-CM | POA: Diagnosis present

## 2024-01-27 DIAGNOSIS — E119 Type 2 diabetes mellitus without complications: Secondary | ICD-10-CM | POA: Insufficient documentation

## 2024-01-27 DIAGNOSIS — G43809 Other migraine, not intractable, without status migrainosus: Secondary | ICD-10-CM | POA: Diagnosis not present

## 2024-01-27 DIAGNOSIS — Z7982 Long term (current) use of aspirin: Secondary | ICD-10-CM | POA: Insufficient documentation

## 2024-01-27 DIAGNOSIS — Z91040 Latex allergy status: Secondary | ICD-10-CM | POA: Insufficient documentation

## 2024-01-27 DIAGNOSIS — Z794 Long term (current) use of insulin: Secondary | ICD-10-CM | POA: Diagnosis not present

## 2024-01-27 MED ORDER — KETOROLAC TROMETHAMINE 15 MG/ML IJ SOLN
15.0000 mg | Freq: Once | INTRAMUSCULAR | Status: AC
  Administered 2024-01-27: 15 mg via INTRAVENOUS
  Filled 2024-01-27: qty 1

## 2024-01-27 MED ORDER — SODIUM CHLORIDE 0.9 % IV BOLUS
1000.0000 mL | Freq: Once | INTRAVENOUS | Status: AC
  Administered 2024-01-27: 1000 mL via INTRAVENOUS

## 2024-01-27 MED ORDER — PROCHLORPERAZINE EDISYLATE 10 MG/2ML IJ SOLN
10.0000 mg | Freq: Once | INTRAMUSCULAR | Status: AC
  Administered 2024-01-27: 10 mg via INTRAVENOUS
  Filled 2024-01-27: qty 2

## 2024-01-27 MED ORDER — MAGNESIUM SULFATE 2 GM/50ML IV SOLN
2.0000 g | Freq: Once | INTRAVENOUS | Status: AC
  Administered 2024-01-27: 2 g via INTRAVENOUS
  Filled 2024-01-27: qty 50

## 2024-01-27 MED ORDER — DIPHENHYDRAMINE HCL 50 MG/ML IJ SOLN
25.0000 mg | Freq: Once | INTRAMUSCULAR | Status: AC
  Administered 2024-01-27: 25 mg via INTRAVENOUS
  Filled 2024-01-27: qty 1

## 2024-01-27 NOTE — ED Triage Notes (Signed)
 Pt c/o HA x 4d; home migraine medications not helping

## 2024-01-27 NOTE — Discharge Instructions (Signed)
 Please use Tylenol or ibuprofen for pain.  You may use 600 mg ibuprofen every 6 hours or 1000 mg of Tylenol every 6 hours.  You may choose to alternate between the 2.  This would be most effective.  Not to exceed 4 g of Tylenol within 24 hours.  Not to exceed 3200 mg ibuprofen 24 hours.  Continue to take your allergy medication, follow-up with your primary care doctor if your symptoms return or worsen despite treatment.  Please return to the emergency department if you have severe worsening of your headache, or sinus congestion despite treatment.

## 2024-01-27 NOTE — ED Provider Notes (Signed)
 [place] [facility] Provider Note   CSN: [id number] Arrival date & time: 01/27/24  1849     History  Chief Complaint  Patient presents with   Headache    Tamara Shannon is a 48 y.o. female past medical history seen for diabetes, migraines, hyperlipidemia, asthma who presents with concern for headache on front, right and back of scalp for 4 days.  Not improving with home migraine medications.  She does report having some kind of an upper respiratory illness around a week ago and still has some sinus pressure.   Headache      Home Medications Prior to Admission medications   Medication Sig Start Date End Date Taking? Authorizing Provider  alum & mag hydroxide-simeth (MAALOX MAX) 400-400-40 MG/5ML suspension Take 10 mLs by mouth every 6 (six) hours as needed for indigestion. 08/30/20   Long, Arlyss Repress, MD  aspirin 81 MG chewable tablet Chew by mouth daily.    [provider]  atorvastatin (LIPITOR) 10 MG tablet Take 10 mg by mouth once a week. 08/18/20   [provider]  diphenoxylate-atropine (LOMOTIL) 2.5-0.025 MG tablet Take 1 tablet by mouth 4 (four) times daily as needed for diarrhea or loose stools. 04/10/23   Renne Crigler, PA-C  doxycycline (VIBRAMYCIN) 100 MG capsule Take 1 capsule (100 mg total) by mouth 2 (two) times daily. 09/09/16   Gwyneth Sprout, MD  fluticasone (FLONASE) 50 MCG/ACT nasal spray Place into the nose as needed. 11/13/19   [provider]  gabapentin (NEURONTIN) 300 MG capsule Take 600 mg by mouth 3 (three) times daily. 08/09/20   [provider]  HYDROcodone-acetaminophen (NORCO/VICODIN) 5-325 MG tablet Take 1-2 tablets by mouth every 6 (six) hours as needed. 09/09/16   Gwyneth Sprout, MD  ibuprofen (ADVIL) 800 MG tablet Take 800 mg by mouth daily. 06/01/20   [provider]  insulin detemir (LEVEMIR) 100 UNIT/ML injection Inject 0.3 mLs (30 Units total) into the skin at  bedtime. 08/14/13   Carlus Pavlov, MD  insulin glargine, 1 Unit Dial, (TOUJEO SOLOSTAR) 300 UNIT/ML Solostar Pen INJECT 40 UNITS SUBCUTANEOUSLY AT BEDTIME 06/11/20   [provider]  JANUMET XR [phone] MG TB24 Take 1 tablet by mouth daily. 06/15/20   [provider]  losartan (COZAAR) 25 MG tablet Take 25 mg by mouth daily. 06/12/20   [provider]  naproxen (NAPROSYN) 500 MG tablet Take 1 tablet (500 mg total) by mouth 2 (two) times daily as needed for mild pain, moderate pain or headache (TAKE WITH MEALS.). 01/05/16   Street, [place], PA-C  nitroGLYCERIN (NITROSTAT) 0.4 MG SL tablet Place 1 tablet (0.4 mg total) under the tongue every 5 (five) minutes as needed. 09/03/20 12/02/20  Revankar, Aundra Dubin, MD  pantoprazole (PROTONIX) 40 MG tablet Take 1 tablet (40 mg total) by mouth daily. 08/30/20 09/29/20  Long, Arlyss Repress, MD  polyethylene glycol powder (MIRALAX) 17 GM/SCOOP powder Take 255 g by mouth daily as needed for mild constipation or moderate constipation. 08/30/20   Long, Arlyss Repress, MD  SUMAtriptan (IMITREX) 50 MG tablet Take 1 tablet now then another tablet in 2 hours if no better. 12/16/19   [provider]  traMADol (ULTRAM) 50 MG tablet Take 1 tablet (50 mg total) by mouth every 6 (six) hours as needed. 08/24/15   Benjiman Core, MD  UNKNOWN TO PATIENT     [provider]      Allergies    Lisinopril, Hydrocodone, and Latex  Review of Systems   Review of Systems  Neurological:  Positive for headaches.  All other systems reviewed and are negative.   Physical Exam Updated Vital Signs BP (!) 142/86 (BP Location: Right Arm)   Pulse 88   Temp 98.6 F (37 C) (Oral)   Resp 16   Ht 5\' 6"  (1.676 m)   Wt 93.4 kg   LMP 01/20/2024   SpO2 98%   BMI 33.23 kg/m  Physical Exam Vitals and nursing note reviewed.  Constitutional:      General: She is not in acute distress.    Appearance: Normal appearance.  HENT:     Head: Normocephalic and  atraumatic.  Eyes:     General:        Right eye: No discharge.        Left eye: No discharge.  Cardiovascular:     Rate and Rhythm: Normal rate and regular rhythm.     Heart sounds: No murmur heard.    No friction rub. No gallop.  Pulmonary:     Effort: Pulmonary effort is normal.     Breath sounds: Normal breath sounds.  Abdominal:     General: Bowel sounds are normal.     Palpations: Abdomen is soft.  Skin:    General: Skin is warm and dry.     Capillary Refill: Capillary refill takes less than 2 seconds.  Neurological:     Mental Status: She is alert and oriented to person, place, and time.     Comments: Cranial nerves II through XII grossly intact.  Intact finger-nose, intact heel-to-shin.  Romberg negative, gait normal.  Alert and oriented x3.  Moves all 4 limbs spontaneously, normal coordination.  No pronator drift.  Intact strength 5 out of 5 bilateral upper and lower extremities.    Psychiatric:        Mood and Affect: Mood normal.        Behavior: Behavior normal.     ED Results / Procedures / Treatments   Labs (all labs ordered are listed, but only abnormal results are displayed) Labs Reviewed - No data to display  EKG None  Radiology No results found.  Procedures Procedures    Medications Ordered in ED Medications  sodium chloride 0.9 % bolus 1,000 mL (1,000 mLs Intravenous New Bag/Given 01/27/24 2021)  ketorolac (TORADOL) 15 MG/ML injection 15 mg (15 mg Intravenous Given 01/27/24 2021)  prochlorperazine (COMPAZINE) injection 10 mg (10 mg Intravenous Given 01/27/24 2027)  diphenhydrAMINE (BENADRYL) injection 25 mg (25 mg Intravenous Given 01/27/24 2024)  magnesium sulfate IVPB 2 g 50 mL (2 g Intravenous New Bag/Given 01/27/24 2033)    ED Course/ Medical Decision Making/ A&P                                 Medical Decision Making  This patient is a 48 y.o. female who presents to the ED for concern of headache, migraine.   Differential diagnoses prior  to evaluation: epidural hematoma, subdural hematoma, skull fracture, subarachnoid hemorrhage, unstable cervical spine fracture, concussion vs other MSK injury   Past Medical History / Social History / Additional history: Chart reviewed. Pertinent results include: Diabetes, hypertension, hyperlipidemia, migraines  Physical Exam: Physical exam performed. The pertinent findings include: No focal neurologic deficits, vital signs stable other than some hypertension, blood pressure 157/92  Medications / Treatment: Since physical exam is reassuring, vital signs are reassuring other than some mild  hypertension the patient's symptoms are consistent with previous history of migraine we had a discussion will opt to do migraine cocktail without head imaging or any lab work, I think this plan is reasonable given typical migraine presentation and history of same.  No red flag headache symptoms.   Disposition: After consideration of the diagnostic results and the patients response to treatment, I feel that patient feeling improved after migraine cocktail, will plan to discharge in stable condition, recommend home meds as needed, PCP follow-up as needed.   emergency department workup does not suggest an emergent condition requiring admission or immediate intervention beyond what has been performed at this time. The plan is: as above. The patient is safe for discharge and has been instructed to return immediately for worsening symptoms, change in symptoms or any other concerns.  Final Clinical Impression(s) / ED Diagnoses Final diagnoses:  Other migraine without status migrainosus, not intractable    Rx / DC Orders ED Discharge Orders     None         [place] 01/27/24 2118    Linwood Dibbles, MD 01/28/24 Barry Brunner
